# Patient Record
Sex: Female | Born: 1935 | Race: White | Hispanic: No | Marital: Married | Smoking: Never smoker
Health system: Southern US, Community
[De-identification: ages and names within clinical notes are randomized; demographics above are authoritative.]

## PROBLEM LIST (undated history)

## (undated) DIAGNOSIS — R251 Tremor, unspecified: Secondary | ICD-10-CM

## (undated) DIAGNOSIS — E7800 Pure hypercholesterolemia, unspecified: Secondary | ICD-10-CM

## (undated) DIAGNOSIS — I10 Essential (primary) hypertension: Secondary | ICD-10-CM

## (undated) DIAGNOSIS — I2510 Atherosclerotic heart disease of native coronary artery without angina pectoris: Secondary | ICD-10-CM

## (undated) DIAGNOSIS — M1990 Unspecified osteoarthritis, unspecified site: Secondary | ICD-10-CM

## (undated) DIAGNOSIS — M109 Gout, unspecified: Secondary | ICD-10-CM

## (undated) HISTORY — PX: HAND SURGERY: SHX662

## (undated) HISTORY — PX: ABDOMINAL HYSTERECTOMY: SHX81

---

## 1997-09-17 HISTORY — PX: CARDIAC CATHETERIZATION: SHX172

## 1998-01-10 ENCOUNTER — Other Ambulatory Visit: Admission: RE | Admit: 1998-01-10 | Discharge: 1998-01-10 | Payer: Self-pay | Admitting: Rheumatology

## 1998-01-20 ENCOUNTER — Other Ambulatory Visit: Admission: RE | Admit: 1998-01-20 | Discharge: 1998-01-20 | Payer: Self-pay | Admitting: Rheumatology

## 1998-02-10 ENCOUNTER — Other Ambulatory Visit: Admission: RE | Admit: 1998-02-10 | Discharge: 1998-02-10 | Payer: Self-pay | Admitting: Rheumatology

## 1998-02-17 ENCOUNTER — Ambulatory Visit: Admission: RE | Admit: 1998-02-17 | Discharge: 1998-02-17 | Payer: Self-pay | Admitting: Rheumatology

## 1999-02-27 ENCOUNTER — Ambulatory Visit (HOSPITAL_COMMUNITY): Admission: RE | Admit: 1999-02-27 | Discharge: 1999-02-27 | Payer: Self-pay | Admitting: Rheumatology

## 2000-03-08 ENCOUNTER — Encounter: Payer: Self-pay | Admitting: Rheumatology

## 2000-03-08 ENCOUNTER — Ambulatory Visit (HOSPITAL_COMMUNITY): Admission: RE | Admit: 2000-03-08 | Discharge: 2000-03-08 | Payer: Self-pay | Admitting: Rheumatology

## 2001-01-13 ENCOUNTER — Other Ambulatory Visit: Admission: RE | Admit: 2001-01-13 | Discharge: 2001-01-13 | Payer: Self-pay | Admitting: Rheumatology

## 2001-01-16 ENCOUNTER — Encounter: Payer: Self-pay | Admitting: Rheumatology

## 2001-01-16 ENCOUNTER — Encounter: Admission: RE | Admit: 2001-01-16 | Discharge: 2001-01-16 | Payer: Self-pay | Admitting: Rheumatology

## 2001-03-10 ENCOUNTER — Ambulatory Visit (HOSPITAL_COMMUNITY): Admission: RE | Admit: 2001-03-10 | Discharge: 2001-03-10 | Payer: Self-pay | Admitting: Rheumatology

## 2001-03-10 ENCOUNTER — Encounter: Payer: Self-pay | Admitting: Rheumatology

## 2001-06-09 ENCOUNTER — Encounter (INDEPENDENT_AMBULATORY_CARE_PROVIDER_SITE_OTHER): Payer: Self-pay

## 2001-06-09 ENCOUNTER — Ambulatory Visit (HOSPITAL_COMMUNITY): Admission: RE | Admit: 2001-06-09 | Discharge: 2001-06-09 | Payer: Self-pay | Admitting: Gastroenterology

## 2002-03-11 ENCOUNTER — Encounter: Payer: Self-pay | Admitting: Rheumatology

## 2002-03-11 ENCOUNTER — Ambulatory Visit (HOSPITAL_COMMUNITY): Admission: RE | Admit: 2002-03-11 | Discharge: 2002-03-11 | Payer: Self-pay | Admitting: Rheumatology

## 2002-04-24 ENCOUNTER — Encounter: Admission: RE | Admit: 2002-04-24 | Discharge: 2002-07-23 | Payer: Self-pay | Admitting: Rheumatology

## 2002-11-21 ENCOUNTER — Encounter: Payer: Self-pay | Admitting: Emergency Medicine

## 2002-11-21 ENCOUNTER — Emergency Department (HOSPITAL_COMMUNITY): Admission: EM | Admit: 2002-11-21 | Discharge: 2002-11-21 | Payer: Self-pay | Admitting: Emergency Medicine

## 2002-12-15 ENCOUNTER — Encounter: Admission: RE | Admit: 2002-12-15 | Discharge: 2002-12-15 | Payer: Self-pay | Admitting: *Deleted

## 2002-12-15 ENCOUNTER — Encounter: Payer: Self-pay | Admitting: *Deleted

## 2003-03-31 ENCOUNTER — Ambulatory Visit (HOSPITAL_COMMUNITY): Admission: RE | Admit: 2003-03-31 | Discharge: 2003-03-31 | Payer: Self-pay | Admitting: Rheumatology

## 2003-03-31 ENCOUNTER — Encounter: Payer: Self-pay | Admitting: Rheumatology

## 2003-11-14 ENCOUNTER — Encounter: Admission: RE | Admit: 2003-11-14 | Discharge: 2003-11-14 | Payer: Self-pay | Admitting: Rheumatology

## 2003-11-22 ENCOUNTER — Encounter: Admission: RE | Admit: 2003-11-22 | Discharge: 2003-11-22 | Payer: Self-pay | Admitting: Rheumatology

## 2004-01-28 ENCOUNTER — Ambulatory Visit (HOSPITAL_COMMUNITY): Admission: RE | Admit: 2004-01-28 | Discharge: 2004-01-28 | Payer: Self-pay | Admitting: Neurosurgery

## 2004-03-31 ENCOUNTER — Ambulatory Visit (HOSPITAL_COMMUNITY): Admission: RE | Admit: 2004-03-31 | Discharge: 2004-03-31 | Payer: Self-pay | Admitting: Rheumatology

## 2005-04-02 ENCOUNTER — Ambulatory Visit (HOSPITAL_COMMUNITY): Admission: RE | Admit: 2005-04-02 | Discharge: 2005-04-02 | Payer: Self-pay | Admitting: Rheumatology

## 2006-04-04 ENCOUNTER — Ambulatory Visit (HOSPITAL_COMMUNITY): Admission: RE | Admit: 2006-04-04 | Discharge: 2006-04-04 | Payer: Self-pay | Admitting: Rheumatology

## 2007-04-14 ENCOUNTER — Ambulatory Visit (HOSPITAL_COMMUNITY): Admission: RE | Admit: 2007-04-14 | Discharge: 2007-04-14 | Payer: Self-pay | Admitting: Rheumatology

## 2007-05-07 ENCOUNTER — Encounter: Admission: RE | Admit: 2007-05-07 | Discharge: 2007-06-05 | Payer: Self-pay | Admitting: Rheumatology

## 2007-09-24 ENCOUNTER — Encounter: Admission: RE | Admit: 2007-09-24 | Discharge: 2007-10-23 | Payer: Self-pay | Admitting: Rheumatology

## 2008-04-22 ENCOUNTER — Ambulatory Visit (HOSPITAL_COMMUNITY): Admission: RE | Admit: 2008-04-22 | Discharge: 2008-04-22 | Payer: Self-pay | Admitting: Rheumatology

## 2008-05-03 ENCOUNTER — Emergency Department (HOSPITAL_BASED_OUTPATIENT_CLINIC_OR_DEPARTMENT_OTHER): Admission: EM | Admit: 2008-05-03 | Discharge: 2008-05-03 | Payer: Self-pay | Admitting: Internal Medicine

## 2009-05-05 ENCOUNTER — Emergency Department (HOSPITAL_BASED_OUTPATIENT_CLINIC_OR_DEPARTMENT_OTHER): Admission: EM | Admit: 2009-05-05 | Discharge: 2009-05-05 | Payer: Self-pay | Admitting: Emergency Medicine

## 2009-06-01 ENCOUNTER — Encounter: Admission: RE | Admit: 2009-06-01 | Discharge: 2009-08-30 | Payer: Self-pay | Admitting: Rheumatology

## 2011-02-02 NOTE — Procedures (Signed)
. [facility]  Patient:    Gloria Mcintosh, SEIVERT Visit Number: [id number] MRN: [id number]          Service Type: Attending:  Verlin Grills, M.D. Dictated by:   Verlin Grills, M.D. Proc. Date: 06/09/01                             Procedure Report  PROCEDURE:  Colonoscopy.  PROCEDURE INDICATION:  [name] (date of birth: 04/10/1925) is a 77 year old female with a history of colon polyps removed in 1997.  ENDOSCOPIST:  Verlin Grills, M.D.  PREMEDICATION:  Demerol 30 mg and Versed 5 mg.  ENDOSCOPE:  Olympus pediatric colonoscope.  PROCEDURE:  After obtaining informed consent, [name] was placed in the left lateral decubitus position and administered intravenous Demerol and intravenous Versed to achieve conscious sedation for the procedure.  The patients blood pressure, oxygen saturation, and cardiac rhythm were monitored throughout the procedure and documented in the medical record.  Anal inspection was normal.  Digital rectal exam normal.  The Olympus pediatric video colonoscope was introduced into the rectum and advanced to the cecum.  Colonic preparation for the exam today was satisfactory.  The rectum was normal.  The sigmoid colon and descending colon were normal.  Splenic flexure was normal.  Transverse colon:  Normal.  Hepatic flexure:  From the hepatic flexure, two 0.5 mm sessile polyps were removed with the cold biopsy forceps.  Ascending colon:  Normal.  Cecum and ileocecal valve: From the proximal cecum, a 0.5 mm sessile polyp was removed with the cold biopsy forceps.  Polyp pathology:  Submitted colonic polyps returned tubular adenomatous polyps.  ASSESSMENT: Two 0.5 mm tubular adenomatous polyps were removed from the hepatic flexure; one 0.5 mm tubular adenomatous polyp was removed from the cecum.  RECOMMENDATIONS:  Repeat colonoscopy in five years. Dictated by:    Verlin Grills, M.D. Attending:  Verlin Grills, M.D. DD:  07/18/01 TD:  07/21/01 Job: [id number]

## 2011-10-02 ENCOUNTER — Encounter (HOSPITAL_BASED_OUTPATIENT_CLINIC_OR_DEPARTMENT_OTHER): Payer: Self-pay | Admitting: Emergency Medicine

## 2011-10-02 ENCOUNTER — Emergency Department (HOSPITAL_BASED_OUTPATIENT_CLINIC_OR_DEPARTMENT_OTHER)
Admission: EM | Admit: 2011-10-02 | Discharge: 2011-10-02 | Disposition: A | Discharge status: Home / Self Care | Payer: Medicare Other | Attending: Emergency Medicine | Admitting: Emergency Medicine

## 2011-10-02 ENCOUNTER — Emergency Department (INDEPENDENT_AMBULATORY_CARE_PROVIDER_SITE_OTHER): Payer: Medicare Other

## 2011-10-02 DIAGNOSIS — E119 Type 2 diabetes mellitus without complications: Secondary | ICD-10-CM | POA: Insufficient documentation

## 2011-10-02 DIAGNOSIS — S32509A Unspecified fracture of unspecified pubis, initial encounter for closed fracture: Secondary | ICD-10-CM | POA: Insufficient documentation

## 2011-10-02 DIAGNOSIS — M25559 Pain in unspecified hip: Secondary | ICD-10-CM

## 2011-10-02 DIAGNOSIS — S32599A Other specified fracture of unspecified pubis, initial encounter for closed fracture: Secondary | ICD-10-CM

## 2011-10-02 DIAGNOSIS — M79609 Pain in unspecified limb: Secondary | ICD-10-CM | POA: Insufficient documentation

## 2011-10-02 DIAGNOSIS — I2510 Atherosclerotic heart disease of native coronary artery without angina pectoris: Secondary | ICD-10-CM | POA: Insufficient documentation

## 2011-10-02 DIAGNOSIS — M549 Dorsalgia, unspecified: Secondary | ICD-10-CM

## 2011-10-02 DIAGNOSIS — X58XXXA Exposure to other specified factors, initial encounter: Secondary | ICD-10-CM | POA: Insufficient documentation

## 2011-10-02 DIAGNOSIS — E7800 Pure hypercholesterolemia, unspecified: Secondary | ICD-10-CM | POA: Insufficient documentation

## 2011-10-02 DIAGNOSIS — I10 Essential (primary) hypertension: Secondary | ICD-10-CM | POA: Insufficient documentation

## 2011-10-02 DIAGNOSIS — M439 Deforming dorsopathy, unspecified: Secondary | ICD-10-CM

## 2011-10-02 HISTORY — DX: Tremor, unspecified: R25.1

## 2011-10-02 HISTORY — DX: Essential (primary) hypertension: I10

## 2011-10-02 HISTORY — DX: Atherosclerotic heart disease of native coronary artery without angina pectoris: I25.10

## 2011-10-02 HISTORY — DX: Pure hypercholesterolemia, unspecified: E78.00

## 2011-10-02 MED ORDER — MORPHINE SULFATE 4 MG/ML IJ SOLN
4.0000 mg | Freq: Once | INTRAMUSCULAR | Status: AC
  Administered 2011-10-02: 4 mg via INTRAMUSCULAR
  Filled 2011-10-02: qty 1

## 2011-10-02 MED ORDER — HYDROCODONE-ACETAMINOPHEN 5-500 MG PO TABS: 1.0000 | ORAL_TABLET | Freq: Four times a day (QID) | ORAL | Status: AC | PRN

## 2011-10-02 NOTE — ED Provider Notes (Signed)
History     CSN: [id number]  Arrival date & time 10/02/11  1453   First MD Initiated Contact with Patient 10/02/11 1522      Chief Complaint  Patient presents with  . Back Pain  . Leg Pain    (Consider location/radiation/quality/duration/timing/severity/associated sxs/prior treatment) HPI Comments: Pt with pain to right lower back and buttocks radiating into right hip and upper thigh.  No radiation down legs.  No abd pain.  No neuro deficits.  Has hx of similar pain due to arthritis on left side.  No recent injury.  Saw PMD yesterday, got shot of steroid/pain med.  No better today.  Patient is a 78 y.o. female presenting with back pain. The history is provided by the patient.  Back Pain  This is a new problem. The current episode started 2 days ago. The problem occurs constantly. The problem has not changed since onset.The pain is associated with no known injury. The pain is present in the sacro-iliac joint and gluteal region. The quality of the pain is described as aching. The pain radiates to the right thigh. The pain is moderate. Exacerbated by: ambulation. The pain is the same all the time. Pertinent negatives include no chest pain, no fever, no numbness, no headaches, no abdominal pain, no bowel incontinence, no perianal numbness, no bladder incontinence, no dysuria, no pelvic pain, no paresthesias and no weakness.    Past Medical History  Diagnosis Date  . Tremors of nervous system   . Hypertension   . Diabetes mellitus   . High cholesterol   . Coronary artery disease     Past Surgical History  Procedure Date  . Abdominal hysterectomy   . Hand surgery     trigger finger and carpal tunnel    No family history on file.  History  Substance Use Topics  . Smoking status: Never Smoker   . Smokeless tobacco: Not on file  . Alcohol Use: No    OB History    Grav Para Term Preterm Abortions TAB SAB Ect Mult Living                  Review of Systems  Constitutional:  Negative for fever, chills, diaphoresis and fatigue.  HENT: Negative for congestion, rhinorrhea and sneezing.   Eyes: Negative.   Respiratory: Negative for cough, chest tightness and shortness of breath.   Cardiovascular: Negative for chest pain and leg swelling.  Gastrointestinal: Negative for nausea, vomiting, abdominal pain, diarrhea, blood in stool and bowel incontinence.  Genitourinary: Negative for bladder incontinence, dysuria, frequency, hematuria, flank pain, difficulty urinating and pelvic pain.  Musculoskeletal: Positive for back pain. Negative for arthralgias.  Skin: Negative for rash.  Neurological: Negative for dizziness, speech difficulty, weakness, numbness, headaches and paresthesias.    Allergies  Inderal  Home Medications   Current Outpatient Rx  Name Route Sig Dispense Refill  . HYDROCODONE-ACETAMINOPHEN 5-500 MG PO TABS Oral Take 1-2 tablets by mouth every 6 (six) hours as needed for pain. 15 tablet 0    BP 167/74  Pulse 74  Temp(Src) 97.9 F (36.6 C) (Oral)  Resp 16  Wt 144 lb (65.318 kg)  SpO2 96%  Physical Exam  Constitutional: She is oriented to person, place, and time. She appears well-developed and well-nourished.  HENT:  Head: Normocephalic and atraumatic.  Eyes: Pupils are equal, round, and reactive to light.  Neck: Normal range of motion. Neck supple.  Cardiovascular: Normal rate and regular rhythm.   Murmur heard. Pulmonary/Chest:  Effort normal and breath sounds normal. No respiratory distress. She has no wheezes. She has no rales. She exhibits no tenderness.  Abdominal: Soft. Bowel sounds are normal. There is no tenderness. There is no rebound and no guarding.  Musculoskeletal: Normal range of motion. She exhibits no edema.       Mild pain on ROM right hip.  Some pain on palpation to right SI joint/sciatic area.  No pain along spine, but mild pain to right lower lumbar paraspinal area.  Neg SLR.  Normal symmetric pulses in feet.  Normal  sensation/motor function.  Normal ambulation, although hurts worse in groin/upper right thigh when she ambulates  Lymphadenopathy:    She has no cervical adenopathy.  Neurological: She is alert and oriented to person, place, and time.  Skin: Skin is warm and dry. No rash noted.  Psychiatric: She has a normal mood and affect.    ED Course  Procedures (including critical care time) No results found for this or any previous visit. Dg Lumbar Spine Complete  10/02/2011  *RADIOLOGY REPORT*  Clinical Data: Back pain  LUMBAR SPINE - COMPLETE 4+ VIEW  Comparison: 11/02/2003, lumbar spine MRI 11/14/2003  Findings: Again noted are endplate compression deformities at T10- T12.  Mild rightward curvature centered at L1 noted.  Increase in mild superior endplate compression deformity is noted at to L1.  8 mm anterolisthesis of L4 on L5 noted.  Atheromatous calcification of the normal caliber aorta noted.  Non calcified aneurysm could be radiographically occult.  IMPRESSION: Multiple lumbar spine compression deformities are again noted. There has been minimal increase in superior endplate compression deformity at L1 but no bony retropulsion or other acute abnormality.  Allowing for differences in technique, stable anterolisthesis of L4 on L5.  Original Report Authenticated By: Harrel Lemon, M.D.   Dg Hip Complete Right  10/02/2011  *RADIOLOGY REPORT*  Clinical Data: Right hip pain  RIGHT HIP - COMPLETE 2+ VIEW  Comparison: None.  Findings: Mild bilateral hip degenerative change is noted. Sacroiliac joints are normal. There is minimal cortical irregularity involving the right inferior pubic ramus.  No right hip fracture or dislocation.  IMPRESSION: Minimal cortical irregularity involving the right inferior pubic ramus.  This could represent an insufficiency fracture.  Consider MRI for further evaluation or bone scan if MRI is contraindicated.  Original Report Authenticated By: Harrel Lemon, M.D.     1.  Fracture of pubic ramus       MDM  Most consistent with her past arthritis pain.  Will check x-rays to r/o fracture.  Does not sound suggestive of aortic aneurysym/disscetion (is reproducible with palpation and seems lower in joint area), no swelling/pain to suggest DVT  Exam is consistent with radiographic evidence of possible inferior pubic rami fx.  Feel this is likely etiology for pt's pain.  Do not feel that MRI is needed urgently.  Advised f/u with PMD.  Can refer to ortho as needed      Rolan Bucco, MD 10/02/11 1651

## 2011-10-02 NOTE — ED Notes (Signed)
Pt c/o back & RLE pain since Sun; no injury; saw PMD yest for same; no relief after "cortisone" shot

## 2011-10-02 NOTE — ED Notes (Signed)
Pt's daughter states PCP increased BP med-pt states she had delivered today but has not started yet

## 2012-07-25 ENCOUNTER — Emergency Department (HOSPITAL_BASED_OUTPATIENT_CLINIC_OR_DEPARTMENT_OTHER): Payer: Medicare Other

## 2012-07-25 ENCOUNTER — Other Ambulatory Visit: Payer: Self-pay

## 2012-07-25 ENCOUNTER — Emergency Department (HOSPITAL_BASED_OUTPATIENT_CLINIC_OR_DEPARTMENT_OTHER)
Admission: EM | Admit: 2012-07-25 | Discharge: 2012-07-25 | Disposition: A | Discharge status: Home / Self Care | Payer: Medicare Other | Attending: Emergency Medicine | Admitting: Emergency Medicine

## 2012-07-25 ENCOUNTER — Encounter (HOSPITAL_BASED_OUTPATIENT_CLINIC_OR_DEPARTMENT_OTHER): Payer: Self-pay | Admitting: *Deleted

## 2012-07-25 DIAGNOSIS — I69998 Other sequelae following unspecified cerebrovascular disease: Secondary | ICD-10-CM | POA: Insufficient documentation

## 2012-07-25 DIAGNOSIS — R110 Nausea: Secondary | ICD-10-CM | POA: Insufficient documentation

## 2012-07-25 DIAGNOSIS — M6281 Muscle weakness (generalized): Secondary | ICD-10-CM | POA: Insufficient documentation

## 2012-07-25 DIAGNOSIS — G252 Other specified forms of tremor: Secondary | ICD-10-CM | POA: Insufficient documentation

## 2012-07-25 DIAGNOSIS — R42 Dizziness and giddiness: Secondary | ICD-10-CM

## 2012-07-25 DIAGNOSIS — I10 Essential (primary) hypertension: Secondary | ICD-10-CM | POA: Insufficient documentation

## 2012-07-25 DIAGNOSIS — I2510 Atherosclerotic heart disease of native coronary artery without angina pectoris: Secondary | ICD-10-CM | POA: Insufficient documentation

## 2012-07-25 DIAGNOSIS — G250 Essential tremor: Secondary | ICD-10-CM | POA: Insufficient documentation

## 2012-07-25 DIAGNOSIS — E119 Type 2 diabetes mellitus without complications: Secondary | ICD-10-CM | POA: Insufficient documentation

## 2012-07-25 DIAGNOSIS — Z79899 Other long term (current) drug therapy: Secondary | ICD-10-CM | POA: Insufficient documentation

## 2012-07-25 DIAGNOSIS — E7800 Pure hypercholesterolemia, unspecified: Secondary | ICD-10-CM | POA: Insufficient documentation

## 2012-07-25 LAB — URINALYSIS, ROUTINE W REFLEX MICROSCOPIC
Bilirubin Urine: NEGATIVE
Glucose, UA: NEGATIVE mg/dL
Ketones, ur: NEGATIVE mg/dL
Protein, ur: 100 mg/dL — AB
Urobilinogen, UA: 0.2 mg/dL (ref 0.0–1.0)

## 2012-07-25 LAB — URINE MICROSCOPIC-ADD ON

## 2012-07-25 LAB — COMPREHENSIVE METABOLIC PANEL
ALT: 24 U/L (ref 0–35)
AST: 38 U/L — ABNORMAL HIGH (ref 0–37)
Albumin: 3.7 g/dL (ref 3.5–5.2)
Alkaline Phosphatase: 72 U/L (ref 39–117)
BUN: 33 mg/dL — ABNORMAL HIGH (ref 6–23)
CO2: 21 mEq/L (ref 19–32)
Calcium: 10 mg/dL (ref 8.4–10.5)
Chloride: 99 mEq/L (ref 96–112)
Creatinine, Ser: 1.2 mg/dL — ABNORMAL HIGH (ref 0.50–1.10)
GFR calc Af Amer: 47 mL/min — ABNORMAL LOW (ref >90)
GFR calc non Af Amer: 40 mL/min — ABNORMAL LOW (ref >90)
Glucose, Bld: 116 mg/dL — ABNORMAL HIGH (ref 70–99)
Potassium: 4.2 mEq/L (ref 3.5–5.1)
Sodium: 138 mEq/L (ref 135–145)
Total Bilirubin: 0.2 mg/dL — ABNORMAL LOW (ref 0.3–1.2)
Total Protein: 7.4 g/dL (ref 6.0–8.3)

## 2012-07-25 LAB — CBC WITH DIFFERENTIAL/PLATELET
Eosinophils Absolute: 0.4 10*3/uL (ref 0.0–0.7)
HCT: 33.6 % — ABNORMAL LOW (ref 36.0–46.0)
Hemoglobin: 11.2 g/dL — ABNORMAL LOW (ref 12.0–15.0)
Lymphocytes Relative: 25 % (ref 12–46)
Lymphs Abs: 2 10*3/uL (ref 0.7–4.0)
MCH: 32.1 pg (ref 26.0–34.0)
MCV: 96.3 fL (ref 78.0–100.0)
Monocytes Absolute: 0.8 10*3/uL (ref 0.1–1.0)
Monocytes Relative: 10 % (ref 3–12)
Neutrophils Relative %: 60 % (ref 43–77)
Platelets: 143 10*3/uL — ABNORMAL LOW (ref 150–400)

## 2012-07-25 MED ORDER — METOPROLOL TARTRATE 50 MG PO TABS
50.0000 mg | ORAL_TABLET | Freq: Once | ORAL | Status: AC
  Administered 2012-07-25: 50 mg via ORAL
  Filled 2012-07-25: qty 1

## 2012-07-25 MED ORDER — MECLIZINE HCL 25 MG PO TABS: 25.0000 mg | ORAL_TABLET | Freq: Three times a day (TID) | ORAL | Status: DC | PRN

## 2012-07-25 MED ORDER — MECLIZINE HCL 25 MG PO TABS
25.0000 mg | ORAL_TABLET | Freq: Once | ORAL | Status: AC
  Administered 2012-07-25: 25 mg via ORAL
  Filled 2012-07-25: qty 1

## 2012-07-25 NOTE — ED Notes (Signed)
Complaint of dizzy episode this am that resolved without intervention. Dizziness returned this afternoon.. She lives at [facility] facility and staff called her daughter and notified her of pts complaint. She is alert oriented. Denies pain.

## 2012-07-25 NOTE — ED Provider Notes (Signed)
History     CSN: [id number]  Arrival date & time 07/25/12  5621   First MD Initiated Contact with Patient 07/25/12 1911      Chief Complaint  Patient presents with  . Dizziness    (Consider location/radiation/quality/duration/timing/severity/associated sxs/prior treatment) HPI Comments: Patient with several episodes of dizziness that started this morning.  She reports feeling like she is spinning when she stands and moves.  She feels nauseated and is improved with rest.  No headache, injury or trauma.  No fevers or chills.    Patient is a 78 y.o. female presenting with weakness. The history is provided by the patient.  Weakness The primary symptoms include dizziness. Episode onset: this morning. The neurological symptoms are diffuse. Context: none.  Dizziness also occurs with weakness.  Additional symptoms include weakness and vertigo.    Past Medical History  Diagnosis Date  . Tremors of nervous system   . Hypertension   . Diabetes mellitus   . High cholesterol   . Coronary artery disease     Past Surgical History  Procedure Date  . Abdominal hysterectomy   . Hand surgery     trigger finger and carpal tunnel    No family history on file.  History  Substance Use Topics  . Smoking status: Never Smoker   . Smokeless tobacco: Not on file  . Alcohol Use: No    OB History    Grav Para Term Preterm Abortions TAB SAB Ect Mult Living                  Review of Systems  Neurological: Positive for dizziness, vertigo and weakness.  All other systems reviewed and are negative.    Allergies  Inderal  Home Medications   Current Outpatient Rx  Name  Route  Sig  Dispense  Refill  . COLESEVELAM HCL 625 MG PO TABS   Oral   Take 1,875 mg by mouth 2 (two) times daily with a meal.         . DICLOFENAC SODIUM PO   Oral   Take by mouth.         Marland Kitchen GABAPENTIN PO   Oral   Take by mouth.         Marland Kitchen MICROZIDE PO   Oral   Take by mouth.         .  SYNTHROID PO   Oral   Take by mouth.         Marland Kitchen ZESTRIL PO   Oral   Take by mouth.         . METFORMIN HCL PO   Oral   Take by mouth.         . METOPROLOL TARTRATE PO   Oral   Take by mouth.         . TRAMADOL HCL PO   Oral   Take by mouth.           BP 206/74  Pulse 76  Temp 98.4 F (36.9 C) (Oral)  Resp 20  SpO2 96%  Physical Exam  Nursing note and vitals reviewed. Constitutional: She is oriented to person, place, and time. She appears well-developed and well-nourished. No distress.  HENT:  Head: Normocephalic and atraumatic.  Neck: Normal range of motion. Neck supple.  Cardiovascular: Normal rate and regular rhythm.  Exam reveals no gallop and no friction rub.   No murmur heard. Pulmonary/Chest: Effort normal and breath sounds normal. No respiratory distress. She has no wheezes.  Abdominal: Soft. Bowel sounds are normal. She exhibits no distension. There is no tenderness.  Musculoskeletal: Normal range of motion.  Neurological: She is alert and oriented to person, place, and time. No cranial nerve deficit. She exhibits normal muscle tone. Coordination normal.  Skin: Skin is warm and dry. She is not diaphoretic.    ED Course  Procedures (including critical care time)  Labs Reviewed  GLUCOSE, CAPILLARY - Abnormal; Notable for the following:    Glucose-Capillary 120 (*)     All other components within normal limits  URINALYSIS, ROUTINE W REFLEX MICROSCOPIC  CBC WITH DIFFERENTIAL  COMPREHENSIVE METABOLIC PANEL   No results found.   No diagnosis found.   Date: 07/25/2012  Rate: 75  Rhythm: normal sinus rhythm  QRS Axis: normal  Intervals: normal  ST/T Wave abnormalities: nonspecific T wave changes  Conduction Disutrbances:right bundle branch block  Narrative Interpretation:   Old EKG Reviewed: unchanged    MDM  The patient presents with symptoms and exam consistent with peripheral vertigo.  She was given meclizine and underwent a ct of  the head, ekg, and labs.  The all were essentially unremarkable.  She was hypertensive initially while in the ED but she had not taken her BP meds.  She was given a dose of metoprolol which did return her BP to the acceptable range.  She will be discharged with a prescription for meclizine, to return or follow up for any problems.        Geoffery Lyons, MD 07/26/12 [phone]

## 2012-07-25 NOTE — ED Notes (Signed)
Found patients family member to be pushing patients stretcher to restroom. On arrival to restroom, patients family member stated the patient had to use the restroom. Patients family member encouraged to utilize call bell in the future as pushing the patients stretcher can be harmful to both patient and family member. Patient assisted to restroom and pushed back to room via stretcher by this EMT.

## 2012-07-28 LAB — URINE CULTURE

## 2012-07-29 NOTE — ED Notes (Signed)
+   Urine Chart sent to EDP office for review. 

## 2012-07-30 NOTE — ED Notes (Signed)
Chart returned from EDP office  with rx written by Felicie Morn for doxycycline 100 mg BID x 10 days need to be called to pharmacy.

## 2012-08-02 ENCOUNTER — Telehealth (HOSPITAL_COMMUNITY): Payer: Self-pay | Admitting: Emergency Medicine

## 2012-09-17 DIAGNOSIS — M109 Gout, unspecified: Secondary | ICD-10-CM

## 2012-09-17 HISTORY — DX: Gout, unspecified: M10.9

## 2012-12-21 ENCOUNTER — Emergency Department (HOSPITAL_BASED_OUTPATIENT_CLINIC_OR_DEPARTMENT_OTHER)
Admission: EM | Admit: 2012-12-21 | Discharge: 2012-12-21 | Disposition: A | Discharge status: Home / Self Care | Payer: Medicare Other | Attending: Emergency Medicine | Admitting: Emergency Medicine

## 2012-12-21 ENCOUNTER — Encounter (HOSPITAL_BASED_OUTPATIENT_CLINIC_OR_DEPARTMENT_OTHER): Payer: Self-pay | Admitting: *Deleted

## 2012-12-21 ENCOUNTER — Emergency Department (HOSPITAL_BASED_OUTPATIENT_CLINIC_OR_DEPARTMENT_OTHER): Payer: Medicare Other

## 2012-12-21 DIAGNOSIS — R110 Nausea: Secondary | ICD-10-CM | POA: Insufficient documentation

## 2012-12-21 DIAGNOSIS — E7800 Pure hypercholesterolemia, unspecified: Secondary | ICD-10-CM | POA: Insufficient documentation

## 2012-12-21 DIAGNOSIS — Z8669 Personal history of other diseases of the nervous system and sense organs: Secondary | ICD-10-CM | POA: Insufficient documentation

## 2012-12-21 DIAGNOSIS — Z79899 Other long term (current) drug therapy: Secondary | ICD-10-CM | POA: Insufficient documentation

## 2012-12-21 DIAGNOSIS — R142 Eructation: Secondary | ICD-10-CM | POA: Insufficient documentation

## 2012-12-21 DIAGNOSIS — I2510 Atherosclerotic heart disease of native coronary artery without angina pectoris: Secondary | ICD-10-CM | POA: Insufficient documentation

## 2012-12-21 DIAGNOSIS — E119 Type 2 diabetes mellitus without complications: Secondary | ICD-10-CM | POA: Insufficient documentation

## 2012-12-21 DIAGNOSIS — R0602 Shortness of breath: Secondary | ICD-10-CM | POA: Insufficient documentation

## 2012-12-21 DIAGNOSIS — Z90710 Acquired absence of both cervix and uterus: Secondary | ICD-10-CM | POA: Insufficient documentation

## 2012-12-21 DIAGNOSIS — R141 Gas pain: Secondary | ICD-10-CM | POA: Insufficient documentation

## 2012-12-21 DIAGNOSIS — I10 Essential (primary) hypertension: Secondary | ICD-10-CM | POA: Insufficient documentation

## 2012-12-21 DIAGNOSIS — K5900 Constipation, unspecified: Secondary | ICD-10-CM | POA: Insufficient documentation

## 2012-12-21 DIAGNOSIS — N200 Calculus of kidney: Secondary | ICD-10-CM | POA: Insufficient documentation

## 2012-12-21 LAB — CBC WITH DIFFERENTIAL/PLATELET
Basophils Absolute: 0.1 10*3/uL (ref 0.0–0.1)
Eosinophils Relative: 2 % (ref 0–5)
HCT: 37 % (ref 36.0–46.0)
Lymphocytes Relative: 13 % (ref 12–46)
Lymphs Abs: 1.7 10*3/uL (ref 0.7–4.0)
MCV: 97.1 fL (ref 78.0–100.0)
Monocytes Absolute: 1 10*3/uL (ref 0.1–1.0)
RDW: 13.4 % (ref 11.5–15.5)
WBC: 13 10*3/uL — ABNORMAL HIGH (ref 4.0–10.5)

## 2012-12-21 LAB — COMPREHENSIVE METABOLIC PANEL
CO2: 20 mEq/L (ref 19–32)
Calcium: 10 mg/dL (ref 8.4–10.5)
Creatinine, Ser: 1.3 mg/dL — ABNORMAL HIGH (ref 0.50–1.10)
GFR calc Af Amer: 42 mL/min — ABNORMAL LOW (ref >90)
GFR calc non Af Amer: 36 mL/min — ABNORMAL LOW (ref >90)
Glucose, Bld: 201 mg/dL — ABNORMAL HIGH (ref 70–99)

## 2012-12-21 LAB — URINALYSIS, ROUTINE W REFLEX MICROSCOPIC
Leukocytes, UA: NEGATIVE
Protein, ur: 100 mg/dL — AB
Urobilinogen, UA: 0.2 mg/dL (ref 0.0–1.0)

## 2012-12-21 LAB — LIPASE, BLOOD: Lipase: 98 U/L — ABNORMAL HIGH (ref 11–59)

## 2012-12-21 LAB — LACTIC ACID, PLASMA: Lactic Acid, Venous: 3.1 mmol/L — ABNORMAL HIGH (ref 0.5–2.2)

## 2012-12-21 MED ORDER — OXYCODONE-ACETAMINOPHEN 5-325 MG PO TABS: 1.0000 | ORAL_TABLET | ORAL | Status: DC | PRN

## 2012-12-21 MED ORDER — MORPHINE SULFATE 4 MG/ML IJ SOLN
4.0000 mg | Freq: Once | INTRAMUSCULAR | Status: AC
  Administered 2012-12-21: 4 mg via INTRAVENOUS
  Filled 2012-12-21: qty 1

## 2012-12-21 MED ORDER — OXYCODONE-ACETAMINOPHEN 5-325 MG PO TABS
1.0000 | ORAL_TABLET | Freq: Once | ORAL | Status: AC
  Administered 2012-12-21: 1 via ORAL
  Filled 2012-12-21 (×2): qty 1

## 2012-12-21 MED ORDER — IOHEXOL 300 MG/ML  SOLN
50.0000 mL | Freq: Once | INTRAMUSCULAR | Status: AC | PRN
  Administered 2012-12-21: 50 mL via ORAL

## 2012-12-21 MED ORDER — IOHEXOL 300 MG/ML  SOLN
100.0000 mL | Freq: Once | INTRAMUSCULAR | Status: AC | PRN
  Administered 2012-12-21: 100 mL via INTRAVENOUS

## 2012-12-21 MED ORDER — TAMSULOSIN HCL 0.4 MG PO CAPS: 0.4000 mg | ORAL_CAPSULE | Freq: Every day | ORAL | Status: AC

## 2012-12-21 MED ORDER — GLIPIZIDE 10 MG PO TABS: 5.0000 mg | ORAL_TABLET | Freq: Two times a day (BID) | ORAL | Status: DC

## 2012-12-21 MED ORDER — ONDANSETRON HCL 4 MG/2ML IJ SOLN
4.0000 mg | Freq: Once | INTRAMUSCULAR | Status: AC
  Administered 2012-12-21: 4 mg via INTRAVENOUS
  Filled 2012-12-21: qty 2

## 2012-12-21 NOTE — ED Notes (Signed)
MD with pt  

## 2012-12-21 NOTE — ED Notes (Signed)
Pt describes LLQ pain and high bp onset 1 hr ago. Abd distended.

## 2012-12-21 NOTE — ED Provider Notes (Addendum)
History    This chart was scribed for Gloria Sprout, MD scribed by Magnus Sinning. The patient was seen in room MH02/MH02 at 00:55.   CSN: [id number]  Arrival date & time 12/21/12  0011     Chief Complaint  Patient presents with  . Abdominal Pain    (Consider location/radiation/quality/duration/timing/severity/associated sxs/prior treatment) Patient is a 79 y.o. female presenting with abdominal pain. The history is provided by the patient. No language interpreter was used.  Abdominal Pain Associated symptoms: constipation and nausea   Associated symptoms: no diarrhea and no vomiting    Gloria Mcintosh is a 79 y.o. female who presents to the Emergency Department complaining of sudden onset constant severe abd pain, onset one hour ago with associated abnormal bloating, SOB, and nausea. The abd pain reportedly woke the patient from her sleep and she states she has had two BMs today and she notes mild constipation. Pain is 9/10 without radiation.  She initially denies any abd surgeries, but later notes that she did have caesarean section many years ago. She denies any recent medication changes or recent stopping in medications.   Past Medical History  Diagnosis Date  . Tremors of nervous system   . Hypertension   . Diabetes mellitus   . High cholesterol   . Coronary artery disease     Past Surgical History  Procedure Laterality Date  . Abdominal hysterectomy    . Hand surgery      trigger finger and carpal tunnel    History reviewed. No pertinent family history.  History  Substance Use Topics  . Smoking status: Never Smoker   . Smokeless tobacco: Not on file  . Alcohol Use: No    Review of Systems  Gastrointestinal: Positive for nausea, abdominal pain, constipation and abdominal distention. Negative for vomiting and diarrhea.  All other systems reviewed and are negative.    Allergies  Inderal  Home Medications   Current Outpatient Rx  Name  Route  Sig   Dispense  Refill  . colesevelam (WELCHOL) 625 MG tablet   Oral   Take 1,875 mg by mouth 2 (two) times daily with a meal.         . DICLOFENAC SODIUM PO   Oral   Take by mouth.         Marland Kitchen GABAPENTIN PO   Oral   Take by mouth.         . Hydrochlorothiazide (MICROZIDE PO)   Oral   Take by mouth.         . Levothyroxine Sodium (SYNTHROID PO)   Oral   Take by mouth.         . Lisinopril (ZESTRIL PO)   Oral   Take by mouth.         . meclizine (ANTIVERT) 25 MG tablet   Oral   Take 1 tablet (25 mg total) by mouth 3 (three) times daily as needed.   15 tablet   1   . METFORMIN HCL PO   Oral   Take by mouth.         . METOPROLOL TARTRATE PO   Oral   Take by mouth.         . TRAMADOL HCL PO   Oral   Take by mouth.           BP 205/130  Pulse 72  Temp(Src) 97.7 F (36.5 C) (Oral)  Resp 20  Ht 5\' 1"  (1.549 m)  Wt 141 lb (63.957  kg)  BMI 26.66 kg/m2  SpO2 94%  Physical Exam  Nursing note and vitals reviewed. Constitutional: She is oriented to person, place, and time. She appears well-developed and well-nourished. No distress.  HENT:  Head: Normocephalic and atraumatic.  Eyes: Conjunctivae and EOM are normal.  Neck: Neck supple. No tracheal deviation present.  Cardiovascular: Normal rate, regular rhythm, normal heart sounds and intact distal pulses.   Pulmonary/Chest: Effort normal and breath sounds normal. No respiratory distress. She has no wheezes. She has no rales.  Abdominal: Soft. Bowel sounds are normal. She exhibits distension. There is no rebound and no guarding.  Firm tender palpable mass in mid lateral left abd that is tender. No hernias  Musculoskeletal: Normal range of motion. She exhibits no edema and no tenderness.  Neurological: She is alert and oriented to person, place, and time. No sensory deficit.  Skin: Skin is warm and dry.  Psychiatric: She has a normal mood and affect. Her behavior is normal.    ED Course  Procedures  (including critical care time) DIAGNOSTIC STUDIES: Oxygen Saturation is 94% on room air, adequate by my interpretation.    COORDINATION OF CARE: 00:57: Physical exam performed.   Labs Reviewed  CBC WITH DIFFERENTIAL - Abnormal; Notable for the following:    WBC 13.0 (*)    RBC 3.81 (*)    Neutro Abs 10.0 (*)    All other components within normal limits  COMPREHENSIVE METABOLIC PANEL - Abnormal; Notable for the following:    Glucose, Bld 201 (*)    BUN 31 (*)    Creatinine, Ser 1.30 (*)    AST 43 (*)    Total Bilirubin 0.2 (*)    GFR calc non Af Amer 36 (*)    GFR calc Af Amer 42 (*)    All other components within normal limits  LIPASE, BLOOD - Abnormal; Notable for the following:    Lipase 98 (*)    All other components within normal limits  URINALYSIS, ROUTINE W REFLEX MICROSCOPIC - Abnormal; Notable for the following:    APPearance CLOUDY (*)    Glucose, UA 100 (*)    Hgb urine dipstick MODERATE (*)    Protein, ur 100 (*)    All other components within normal limits  LACTIC ACID, PLASMA - Abnormal; Notable for the following:    Lactic Acid, Venous 3.1 (*)    All other components within normal limits  URINE MICROSCOPIC-ADD ON - Abnormal; Notable for the following:    Bacteria, UA MANY (*)    All other components within normal limits  URINE CULTURE   Ct Abdomen Pelvis W Contrast  12/21/2012  *RADIOLOGY REPORT*  Clinical Data: Left abdominal pain with palpable mass.  Nausea and constipation.  White cell count 13.  CT ABDOMEN AND PELVIS WITH CONTRAST  Technique:  Multidetector CT imaging of the abdomen and pelvis was performed following the standard protocol during bolus administration of intravenous contrast.  Contrast: 50mL OMNIPAQUE IOHEXOL 300 MG/ML  SOLN, OMNIPAQUE IOHEXOL 300 MG/ML  SOLN  Comparison: None.  Findings: Atelectasis in the lung bases.  Calcification of the aorta and coronary arteries.  There is enlargement of the lateral segment of the left lobe of the  liver and the caudate lobe with mild nodular contour of the liver suggesting cirrhosis.  Low attenuation lesion in the anterior segment of the right lobe of the liver measuring 13 mm diameter and the second adjacent lesion measuring 3 mm diameter.  These do not  completely fill in on delayed imaging.  These may represent hemangiomas but have a somewhat atypical appearance.  Consider elective MRI for further evaluation of focal liver lesions. Calcified granuloma in the liver.  The gallbladder, spleen, pancreas, and adrenal glands are unremarkable.  There is enlargement of the left kidney with pararenal stranding.  4 mm stone in the proximal left ureter at the ureteropelvic junction with mild proximal pyelocaliectasis consistent with moderate obstruction.  There are also parapelvic cysts on the left. Bilateral renal parenchymal atrophy.  No solid lesions appreciated. Calcification of the aorta without aneurysm.  Inferior vena cava and retroperitoneal lymph nodes are unremarkable.  Stomach and small bowel are normal.  Stool filled colon without abnormal distension.  Prominent visceral adipose tissues.  No free air or free fluid in the abdomen.  Pelvis:  No free or loculated pelvic fluid collections.  Uterus appears to be surgically absent.  No abnormal adnexal masses.  No evidence of diverticulitis.  Appendix is not visualized.  No free or loculated pelvic fluid collections.  No significant pelvic lymphadenopathy.  IMPRESSION: 4 mm stone in the left ureteropelvic junction with moderate proximal obstruction.  Indeterminate lesion in the right lobe of the liver.  Consider MRI for further characterization. Configuration of the liver suggests cirrhosis.   Original Report Authenticated By: Burman Nieves, M.D.      1. Kidney stone       MDM   Patient with abrupt onset of abdominal pain tonight that woke her from sleep. She denies any urinary symptoms and states she had 2 bowel movements today with possible mild  constipation. She has had severe nausea since waking up but denies vomiting. Patient was normal when she went to bed. She does have a history of coronary artery disease, diabetes and hypertension. She is hypertensive today to 205/130. Her pulse and respiratory rate are normal her O2 sats are 94%. She does have a palpable mass in her left abdomen of uncertain etiology. She is also distended but her abdomen is soft.  Concern for kidney stone versus intestinal pathology such as obstruction or volvulus.  Lower concern for aorta pathology such as AAA rupture or dissection given the patient has normal pulses in all extremities and is hypertensive. CBC, CMP, lipase, UA, lactic acid, CT of abdomen and pelvis pending. Patient given IV pain and nausea control  3:50 AM Mild leukocytosis and UA without signficant infection but is positive for blood.  Lipase indeterminate with mild elevation.  CT with evidence of 4mm left kidney stone which would explain pt's sx.  After 4 of morphine pain down to a 7 will give second dose.  Pt is diabetic and will have her hold her metformin after CT with contrast.  She was given glipizide for the next 3 days.  4:58 AM Pain is improved and will d/c home.  I personally performed the services described in this documentation, which was scribed in my presence.  The recorded information has been reviewed and considered.         Gloria Sprout, MD 12/21/12 3086  Gloria Sprout, MD 12/21/12 5784  Gloria Sprout, MD 12/21/12 0500

## 2012-12-23 LAB — URINE CULTURE: Colony Count: >=100000

## 2012-12-24 ENCOUNTER — Telehealth (HOSPITAL_COMMUNITY): Payer: Self-pay | Admitting: Emergency Medicine

## 2012-12-24 NOTE — ED Notes (Signed)
Patient has +Urine culture. °

## 2012-12-24 NOTE — ED Notes (Signed)
+   Urine Chart sent to EDP office for review. 

## 2012-12-25 ENCOUNTER — Telehealth (HOSPITAL_COMMUNITY): Payer: Self-pay | Admitting: Emergency Medicine

## 2012-12-26 ENCOUNTER — Emergency Department (HOSPITAL_COMMUNITY): Payer: Medicare Other

## 2012-12-26 ENCOUNTER — Inpatient Hospital Stay (HOSPITAL_COMMUNITY)
Admission: EM | Admit: 2012-12-26 | Discharge: 2013-01-03 | DRG: 699 | Disposition: A | Discharge status: Skilled Nursing Facility | Payer: Medicare Other | Attending: Internal Medicine | Admitting: Internal Medicine

## 2012-12-26 ENCOUNTER — Encounter (HOSPITAL_COMMUNITY): Payer: Self-pay | Admitting: Emergency Medicine

## 2012-12-26 DIAGNOSIS — E119 Type 2 diabetes mellitus without complications: Secondary | ICD-10-CM

## 2012-12-26 DIAGNOSIS — N135 Crossing vessel and stricture of ureter without hydronephrosis: Secondary | ICD-10-CM | POA: Diagnosis present

## 2012-12-26 DIAGNOSIS — F29 Unspecified psychosis not due to a substance or known physiological condition: Secondary | ICD-10-CM | POA: Diagnosis present

## 2012-12-26 DIAGNOSIS — N19 Unspecified kidney failure: Secondary | ICD-10-CM

## 2012-12-26 DIAGNOSIS — Z66 Do not resuscitate: Secondary | ICD-10-CM | POA: Diagnosis not present

## 2012-12-26 DIAGNOSIS — I10 Essential (primary) hypertension: Secondary | ICD-10-CM | POA: Diagnosis present

## 2012-12-26 DIAGNOSIS — I2510 Atherosclerotic heart disease of native coronary artery without angina pectoris: Secondary | ICD-10-CM | POA: Diagnosis present

## 2012-12-26 DIAGNOSIS — N183 Chronic kidney disease, stage 3 unspecified: Secondary | ICD-10-CM | POA: Diagnosis present

## 2012-12-26 DIAGNOSIS — R4182 Altered mental status, unspecified: Secondary | ICD-10-CM

## 2012-12-26 DIAGNOSIS — E871 Hypo-osmolality and hyponatremia: Secondary | ICD-10-CM

## 2012-12-26 DIAGNOSIS — N390 Urinary tract infection, site not specified: Secondary | ICD-10-CM

## 2012-12-26 DIAGNOSIS — N200 Calculus of kidney: Secondary | ICD-10-CM | POA: Diagnosis present

## 2012-12-26 DIAGNOSIS — I129 Hypertensive chronic kidney disease with stage 1 through stage 4 chronic kidney disease, or unspecified chronic kidney disease: Secondary | ICD-10-CM | POA: Diagnosis present

## 2012-12-26 DIAGNOSIS — N139 Obstructive and reflux uropathy, unspecified: Principal | ICD-10-CM | POA: Diagnosis present

## 2012-12-26 DIAGNOSIS — N1330 Unspecified hydronephrosis: Secondary | ICD-10-CM | POA: Diagnosis present

## 2012-12-26 DIAGNOSIS — N179 Acute kidney failure, unspecified: Secondary | ICD-10-CM | POA: Diagnosis present

## 2012-12-26 HISTORY — DX: Gout, unspecified: M10.9

## 2012-12-26 HISTORY — DX: Unspecified osteoarthritis, unspecified site: M19.90

## 2012-12-26 LAB — URINALYSIS, ROUTINE W REFLEX MICROSCOPIC
Glucose, UA: NEGATIVE mg/dL
Nitrite: POSITIVE — AB
Protein, ur: 100 mg/dL — AB
pH: 5.5 (ref 5.0–8.0)

## 2012-12-26 LAB — COMPREHENSIVE METABOLIC PANEL
AST: 26 U/L (ref 0–37)
Albumin: 2.5 g/dL — ABNORMAL LOW (ref 3.5–5.2)
Calcium: 9 mg/dL (ref 8.4–10.5)
Chloride: 85 mEq/L — ABNORMAL LOW (ref 96–112)
Creatinine, Ser: 4.69 mg/dL — ABNORMAL HIGH (ref 0.50–1.10)
Total Bilirubin: 0.3 mg/dL (ref 0.3–1.2)
Total Protein: 7.2 g/dL (ref 6.0–8.3)

## 2012-12-26 LAB — CBC WITH DIFFERENTIAL/PLATELET
Basophils Absolute: 0 10*3/uL (ref 0.0–0.1)
Basophils Relative: 0 % (ref 0–1)
Eosinophils Absolute: 0 10*3/uL (ref 0.0–0.7)
HCT: 28.7 % — ABNORMAL LOW (ref 36.0–46.0)
MCH: 31.5 pg (ref 26.0–34.0)
MCHC: 35.9 g/dL (ref 30.0–36.0)
Monocytes Absolute: 0.5 10*3/uL (ref 0.1–1.0)
Neutro Abs: 11.8 10*3/uL — ABNORMAL HIGH (ref 1.7–7.7)
RDW: 13.5 % (ref 11.5–15.5)

## 2012-12-26 LAB — PRO B NATRIURETIC PEPTIDE: Pro B Natriuretic peptide (BNP): 2178 pg/mL — ABNORMAL HIGH (ref 0–450)

## 2012-12-26 LAB — URINE MICROSCOPIC-ADD ON

## 2012-12-26 MED ORDER — SODIUM CHLORIDE 0.9 % IV BOLUS (SEPSIS)
1000.0000 mL | Freq: Once | INTRAVENOUS | Status: AC
  Administered 2012-12-26: 1000 mL via INTRAVENOUS

## 2012-12-26 MED ORDER — DEXTROSE 5 % IV SOLN
1.0000 g | Freq: Once | INTRAVENOUS | Status: AC
  Administered 2012-12-26: 1 g via INTRAVENOUS
  Filled 2012-12-26: qty 10

## 2012-12-26 NOTE — ED Notes (Signed)
Pt brought to ED via [facility] from [facility] assisted living facility.  Pt PCP Dr. Nehemiah Settle office called pt today and told her to come to ED due to abnormal renal lab results.  Pt complaining of weakness for the past month, family reports pt has had decreased mobility for the past month as well.  Pt currently on antibiotics for UTI at present.  Alert and oriented X4, will continue to monitor pt.

## 2012-12-26 NOTE — ED Notes (Signed)
Dr. Karma Ganja informed of pt Sodium level.

## 2012-12-26 NOTE — ED Provider Notes (Signed)
History     CSN: [id number]  Arrival date & time 12/26/12  4098   First MD Initiated Contact with Patient 12/26/12 1836      Chief Complaint  Patient presents with  . Abnormal Lab    (Consider location/radiation/quality/duration/timing/severity/associated sxs/prior treatment) HPI A LEVEL 5 CAVEAT PERTAINS DUE TO ALTERED MENTAL STATUS Pt presents with c/o decreased mental status and was advised to come to the ED by her PMD today due to abnormal renal labs.  Per son, pt was diagnosed with renal stone approx 1 week ago.  Was placed on pain medications.  Since then she has been more somnolent and weaker than her usual state, he says she usually can do all of her daily activities for herself but has been very fatigued and sleeping more than usual.  Pt saw urology in followup and was started on antibiotics for UTI at that time- son not sure which antibiotic.  Saw her PMD today and was advised to come to the ED due to abnormal labs.     Past Medical History  Diagnosis Date  . Tremors of nervous system   . Hypertension   . Diabetes mellitus   . High cholesterol   . Coronary artery disease   . Gout 2014  . Arthritis     right shoulder    Past Surgical History  Procedure Laterality Date  . Abdominal hysterectomy    . Hand surgery      trigger finger and carpal tunnel  . Cardiac catheterization  1999    stent to LAD    Family History  Problem Relation Age of Onset  . Rheum arthritis Mother   . Lymphoma Mother   . Hypertension Mother   . Diabetes type II Father   . Stroke Sister   . Diabetes type II Brother   . Hypertension Daughter   . Hypertension Son   . Diabetes type II Son     History  Substance Use Topics  . Smoking status: Never Smoker   . Smokeless tobacco: Not on file  . Alcohol Use: No    OB History   Grav Para Term Preterm Abortions TAB SAB Ect Mult Living                  Review of Systems UNABLE TO OBTAIN ROS DUE TO LEVEL 5 CAVEAT  Allergies   Inderal  Home Medications   No current outpatient prescriptions on file.  BP 136/55  Pulse 87  Temp(Src) 97.9 F (36.6 C) (Oral)  Resp 22  Ht 5\' 2"  (1.575 m)  Wt 140 lb 14 oz (63.9 kg)  BMI 25.76 kg/m2  SpO2 100% Vitals reviewed Physical Exam Physical Examination: General appearance - alert, well appearing, and in no distress Mental status - alert, oriented to person, place, and time Eyes - pupils equal and reactive, extraocular eye movements intact Mouth - mucous membranes moist, pharynx normal without lesions Chest - clear to auscultation, no wheezes, rales or rhonchi, symmetric air entry Heart - normal rate, regular rhythm, normal S1, S2, no murmurs, rubs, clicks or gallops Abdomen - soft, nontender, nondistended, no masses or organomegaly Neurological - alert, oriented, normal speech, no focal findings or movement disorder noted Extremities - peripheral pulses normal, no pedal edema, no clubbing or cyanosis Skin - normal coloration and turgor, no rashes, no suspicious skin lesions noted  ED Course  Procedures (including critical care time)   Date: 12/26/2012  Rate: 84  Rhythm: normal sinus rhythm  QRS  Axis: normal  Intervals: normal  ST/T Wave abnormalities: nonspecific ST/T changes  Conduction Disutrbances:right bundle branch block and left posterior fascicular block  Narrative Interpretation:   Old EKG Reviewed: no significant changes compared to prior ekg of 07/25/12   11:17 PM d/w Dr. Adela Glimpse, hospitalist, she will see patient in the ED.  CT scan ordered to assess stone.  Updated family at bedside about findings and plan.   12:32 AM stone is present, has migrated distally- moderate hydronephrosis and perinephric stranding still present.  D/w Dr. Retta Diones, he will come see patient in the ED.  [facility] will admit to step down and has seen patient in the ED as well.   Labs Reviewed  CBC WITH DIFFERENTIAL - Abnormal; Notable for the following:    WBC 12.9 (*)     RBC 3.27 (*)    Hemoglobin 10.3 (*)    HCT 28.7 (*)    Neutrophils Relative 92 (*)    Neutro Abs 11.8 (*)    Lymphocytes Relative 4 (*)    Lymphs Abs 0.5 (*)    All other components within normal limits  COMPREHENSIVE METABOLIC PANEL - Abnormal; Notable for the following:    Sodium 119 (*)    Chloride 85 (*)    CO2 17 (*)    Glucose, Bld 209 (*)    BUN 56 (*)    Creatinine, Ser 4.69 (*)    Albumin 2.5 (*)    GFR calc non Af Amer 8 (*)    GFR calc Af Amer 9 (*)    All other components within normal limits  PRO B NATRIURETIC PEPTIDE - Abnormal; Notable for the following:    Pro B Natriuretic peptide (BNP) 2178.0 (*)    All other components within normal limits  URINALYSIS, ROUTINE W REFLEX MICROSCOPIC - Abnormal; Notable for the following:    APPearance CLOUDY (*)    Hgb urine dipstick LARGE (*)    Protein, ur 100 (*)    Nitrite POSITIVE (*)    Leukocytes, UA MODERATE (*)    All other components within normal limits  URINE MICROSCOPIC-ADD ON - Abnormal; Notable for the following:    Bacteria, UA MANY (*)    Casts HYALINE CASTS (*)    All other components within normal limits  GLUCOSE, CAPILLARY - Abnormal; Notable for the following:    Glucose-Capillary 149 (*)    All other components within normal limits  HEMOGLOBIN A1C - Abnormal; Notable for the following:    Hemoglobin A1C 7.2 (*)    Mean Plasma Glucose 160 (*)    All other components within normal limits  TSH - Abnormal; Notable for the following:    TSH 7.382 (*)    All other components within normal limits  COMPREHENSIVE METABOLIC PANEL - Abnormal; Notable for the following:    Sodium 124 (*)    Chloride 91 (*)    CO2 17 (*)    Glucose, Bld 128 (*)    BUN 64 (*)    Creatinine, Ser 5.19 (*)    Albumin 2.3 (*)    Total Bilirubin 0.2 (*)    GFR calc non Af Amer 7 (*)    GFR calc Af Amer 8 (*)    All other components within normal limits  CBC - Abnormal; Notable for the following:    WBC 12.8 (*)    RBC 3.24  (*)    Hemoglobin 10.4 (*)    HCT 28.8 (*)    MCHC 36.1 (*)  Platelets 149 (*)    All other components within normal limits  GLUCOSE, CAPILLARY - Abnormal; Notable for the following:    Glucose-Capillary 152 (*)    All other components within normal limits  GLUCOSE, CAPILLARY - Abnormal; Notable for the following:    Glucose-Capillary 117 (*)    All other components within normal limits  GLUCOSE, CAPILLARY - Abnormal; Notable for the following:    Glucose-Capillary 100 (*)    All other components within normal limits  GLUCOSE, CAPILLARY - Abnormal; Notable for the following:    Glucose-Capillary 154 (*)    All other components within normal limits  MRSA PCR SCREENING  URINE CULTURE  CULTURE, BLOOD (ROUTINE X 2)  CULTURE, BLOOD (ROUTINE X 2)  MAGNESIUM  PHOSPHORUS  LACTIC ACID, PLASMA  SODIUM, URINE, RANDOM  CREATININE, URINE, RANDOM  OSMOLALITY, URINE  BASIC METABOLIC PANEL  CBC   Ct Abdomen Pelvis Wo Contrast  12/27/2012  *RADIOLOGY REPORT*  Clinical Data: Urinary tract infection.  Urolithiasis.Renal insufficiency.  CT ABDOMEN AND PELVIS WITHOUT CONTRAST  Technique:  Multidetector CT imaging of the abdomen and pelvis was performed following the standard protocol without intravenous contrast.  Comparison: 12/21/2012  Findings: Mild increase in left-sided hydronephrosis and perinephric stranding are seen.  Increased dilatation of left ureter is seen and the previously noted calculus near the left ureteral pelvic junction has migrated distally to the ureterovesicle junction.  This measures approximately 4 mm.  An air- fluid level is seen in the urinary bladder likely from recent bladder catheterization.  No evidence of right-sided ureteral calculus or hydronephrosis.  Prior hysterectomy noted.  The other abdominal parenchymal organs show no significant abnormality.  Small low attenuation lesions in the right hepatic lobe are stable likely representing tiny benign cysts or hemangiomas.   Gallbladder is unremarkable.  No soft tissue masses or lymphadenopathy identified.  No evidence of dilated bowel loops.  IMPRESSION:  4 mm left ureteral calculus shows distal migration, now 1 year the left ureterovesicle junction.  Mild increase in moderate left hydronephrosis and perinephric stranding.   Original Report Authenticated By: Myles Rosenthal, M.D.    Dg Chest 2 View  12/26/2012  *RADIOLOGY REPORT*  Clinical Data: Weakness, abnormal labs  CHEST - 2 VIEW  Comparison: None.  Findings: Lungs are clear. No pleural effusion or pneumothorax.  Mild cardiomegaly.  Mild degenerative changes of the visualized thoracolumbar spine.  Vascular calcifications.  IMPRESSION: No evidence of acute cardiopulmonary disease.   Original Report Authenticated By: Charline Bills, M.D.    Ct Head Wo Contrast  12/26/2012  *RADIOLOGY REPORT*  Clinical Data: 79 year old female with weakness.  Recent urinary tract infection.  CT HEAD WITHOUT CONTRAST  Technique:  Contiguous axial images were obtained from the base of the skull through the vertex without contrast.  Comparison: 07/25/2012 and earlier.  Findings: Visualized paranasal sinuses and mastoids are clear.  No acute osseous abnormality identified.  Visualized orbits and scalp soft tissues are within normal limits.  Calcified atherosclerosis at the skull base.  Cerebral volume is stable and normal for age.  No ventriculomegaly. No midline shift, mass effect, or evidence of mass lesion.  Gray-white matter differentiation is within normal limits throughout the brain.  No evidence of cortically based acute infarction identified.  No suspicious intracranial vascular hyperdensity. No acute intracranial hemorrhage identified.  IMPRESSION: Stable and normal for age noncontrast CT appearance of the brain.   Original Report Authenticated By: Erskine Speed, M.D.      1. Altered mental  status   2. Renal failure   3. Urinary tract infection   4. Acute renal failure   5. Diabetes  mellitus   6. Hyponatremia   7. Hydronephrosis   8. Hypertension   9. Kidney stone       MDM  Pt presenting with decreased mental status, diagnosed with ureteral stone on 12/21/12 and has been having decreased mental status worsening since that time.  Now in renal failure, potassium is normal.  Head CT is reassuring.  Renal failure and altered mental status are likley multifactorial- sedation from pain meds, dehydration.  Also evidence of UTI.  Abdominal CT scan obtained to see if stone has passed.  D/w [facility] for admission.  Pt started on rocephin and IV hydration in the ED.  Family updated at the bedside about findings and plan for admission.         Ethelda Chick, MD 12/27/12 1758

## 2012-12-27 ENCOUNTER — Inpatient Hospital Stay (HOSPITAL_COMMUNITY): Payer: Medicare Other | Admitting: Certified Registered"

## 2012-12-27 ENCOUNTER — Encounter (HOSPITAL_COMMUNITY): Payer: Self-pay | Admitting: Certified Registered"

## 2012-12-27 ENCOUNTER — Encounter (HOSPITAL_COMMUNITY)
Admission: EM | Disposition: A | Discharge status: Skilled Nursing Facility | Payer: Self-pay | Source: Home / Self Care | Attending: Internal Medicine

## 2012-12-27 ENCOUNTER — Inpatient Hospital Stay (HOSPITAL_COMMUNITY): Payer: Medicare Other

## 2012-12-27 DIAGNOSIS — I10 Essential (primary) hypertension: Secondary | ICD-10-CM

## 2012-12-27 DIAGNOSIS — E871 Hypo-osmolality and hyponatremia: Secondary | ICD-10-CM | POA: Diagnosis present

## 2012-12-27 DIAGNOSIS — R4182 Altered mental status, unspecified: Secondary | ICD-10-CM

## 2012-12-27 DIAGNOSIS — N1330 Unspecified hydronephrosis: Secondary | ICD-10-CM | POA: Diagnosis present

## 2012-12-27 DIAGNOSIS — N179 Acute kidney failure, unspecified: Secondary | ICD-10-CM

## 2012-12-27 DIAGNOSIS — N390 Urinary tract infection, site not specified: Secondary | ICD-10-CM

## 2012-12-27 DIAGNOSIS — E119 Type 2 diabetes mellitus without complications: Secondary | ICD-10-CM

## 2012-12-27 DIAGNOSIS — N200 Calculus of kidney: Secondary | ICD-10-CM

## 2012-12-27 HISTORY — PX: CYSTOSCOPY WITH STENT PLACEMENT: SHX5790

## 2012-12-27 LAB — COMPREHENSIVE METABOLIC PANEL
AST: 26 U/L (ref 0–37)
Albumin: 2.3 g/dL — ABNORMAL LOW (ref 3.5–5.2)
BUN: 64 mg/dL — ABNORMAL HIGH (ref 6–23)
Creatinine, Ser: 5.19 mg/dL — ABNORMAL HIGH (ref 0.50–1.10)
Potassium: 4.5 mEq/L (ref 3.5–5.1)
Total Protein: 6.9 g/dL (ref 6.0–8.3)

## 2012-12-27 LAB — CBC
HCT: 28.8 % — ABNORMAL LOW (ref 36.0–46.0)
MCV: 88.9 fL (ref 78.0–100.0)
RBC: 3.24 MIL/uL — ABNORMAL LOW (ref 3.87–5.11)
WBC: 12.8 10*3/uL — ABNORMAL HIGH (ref 4.0–10.5)

## 2012-12-27 LAB — HEMOGLOBIN A1C: Hgb A1c MFr Bld: 7.2 % — ABNORMAL HIGH (ref <5.7)

## 2012-12-27 LAB — OSMOLALITY, URINE: Osmolality, Ur: 196 mOsm/kg — ABNORMAL LOW (ref 390–1090)

## 2012-12-27 LAB — PHOSPHORUS: Phosphorus: 4.3 mg/dL (ref 2.3–4.6)

## 2012-12-27 LAB — TSH: TSH: 7.382 u[IU]/mL — ABNORMAL HIGH (ref 0.350–4.500)

## 2012-12-27 LAB — GLUCOSE, CAPILLARY
Glucose-Capillary: 152 mg/dL — ABNORMAL HIGH (ref 70–99)
Glucose-Capillary: 117 mg/dL — ABNORMAL HIGH (ref 70–99)
Glucose-Capillary: 158 mg/dL — ABNORMAL HIGH (ref 70–99)

## 2012-12-27 LAB — LACTIC ACID, PLASMA: Lactic Acid, Venous: 1.3 mmol/L (ref 0.5–2.2)

## 2012-12-27 LAB — MAGNESIUM: Magnesium: 1.9 mg/dL (ref 1.5–2.5)

## 2012-12-27 LAB — MRSA PCR SCREENING: MRSA by PCR: NEGATIVE

## 2012-12-27 SURGERY — CYSTOSCOPY, WITH STENT INSERTION
Anesthesia: General | Site: Ureter | Laterality: Left | Wound class: Contaminated

## 2012-12-27 MED ORDER — SODIUM CHLORIDE 0.9 % IV SOLN
INTRAVENOUS | Status: DC | PRN
  Administered 2012-12-27 (×2): via INTRAVENOUS

## 2012-12-27 MED ORDER — ONDANSETRON HCL 4 MG/2ML IJ SOLN: 4.0000 mg | Freq: Once | INTRAMUSCULAR | Status: DC | PRN

## 2012-12-27 MED ORDER — PANTOPRAZOLE SODIUM 40 MG IV SOLR
40.0000 mg | Freq: Every day | INTRAVENOUS | Status: DC
  Administered 2012-12-27: 40 mg via INTRAVENOUS
  Filled 2012-12-27 (×2): qty 40

## 2012-12-27 MED ORDER — HYDROCODONE-ACETAMINOPHEN 5-325 MG PO TABS
1.0000 | ORAL_TABLET | ORAL | Status: DC | PRN
  Administered 2012-12-28: 1 via ORAL
  Filled 2012-12-27: qty 1

## 2012-12-27 MED ORDER — SODIUM CHLORIDE 0.9 % IJ SOLN
3.0000 mL | Freq: Two times a day (BID) | INTRAMUSCULAR | Status: DC
  Administered 2012-12-27 – 2013-01-03 (×10): 3 mL via INTRAVENOUS

## 2012-12-27 MED ORDER — METOPROLOL TARTRATE 1 MG/ML IV SOLN
INTRAVENOUS | Status: DC | PRN
  Administered 2012-12-27: 2 mg via INTRAVENOUS

## 2012-12-27 MED ORDER — MORPHINE SULFATE 2 MG/ML IJ SOLN: 1.0000 mg | INTRAMUSCULAR | Status: DC | PRN

## 2012-12-27 MED ORDER — ACETAMINOPHEN 10 MG/ML IV SOLN: 1000.0000 mg | Freq: Once | INTRAVENOUS | Status: DC | PRN

## 2012-12-27 MED ORDER — ACETAMINOPHEN 325 MG PO TABS
650.0000 mg | ORAL_TABLET | Freq: Four times a day (QID) | ORAL | Status: DC | PRN
  Administered 2012-12-27 – 2013-01-02 (×3): 650 mg via ORAL
  Filled 2012-12-27 (×3): qty 2

## 2012-12-27 MED ORDER — PROPOFOL 10 MG/ML IV BOLUS
INTRAVENOUS | Status: DC | PRN
  Administered 2012-12-27: 100 mg via INTRAVENOUS

## 2012-12-27 MED ORDER — STERILE WATER FOR IRRIGATION IR SOLN
Status: DC | PRN
  Administered 2012-12-27: 1000 mL

## 2012-12-27 MED ORDER — ONDANSETRON HCL 4 MG/2ML IJ SOLN: 4.0000 mg | Freq: Four times a day (QID) | INTRAMUSCULAR | Status: DC | PRN

## 2012-12-27 MED ORDER — SODIUM CHLORIDE 0.9 % IV SOLN
INTRAVENOUS | Status: AC
  Administered 2012-12-27: 1000 mL via INTRAVENOUS

## 2012-12-27 MED ORDER — ACETAMINOPHEN 650 MG RE SUPP: 650.0000 mg | Freq: Four times a day (QID) | RECTAL | Status: DC | PRN

## 2012-12-27 MED ORDER — PIPERACILLIN-TAZOBACTAM 3.375 G IVPB 30 MIN
3.3750 g | Freq: Once | INTRAVENOUS | Status: AC
  Administered 2012-12-27: 3.375 g via INTRAVENOUS
  Filled 2012-12-27: qty 50

## 2012-12-27 MED ORDER — INSULIN ASPART 100 UNIT/ML ~~LOC~~ SOLN
0.0000 [IU] | SUBCUTANEOUS | Status: DC
  Administered 2012-12-27 – 2012-12-28 (×4): 2 [IU] via SUBCUTANEOUS
  Administered 2012-12-29: 1 [IU] via SUBCUTANEOUS
  Administered 2012-12-29: 5 [IU] via SUBCUTANEOUS
  Administered 2012-12-29 (×3): 1 [IU] via SUBCUTANEOUS
  Administered 2012-12-30: 5 [IU] via SUBCUTANEOUS
  Administered 2012-12-30: 2 [IU] via SUBCUTANEOUS
  Administered 2012-12-30: 1 [IU] via SUBCUTANEOUS
  Administered 2012-12-30 – 2012-12-31 (×2): 2 [IU] via SUBCUTANEOUS
  Administered 2012-12-31: 1 [IU] via SUBCUTANEOUS
  Administered 2012-12-31 (×3): 3 [IU] via SUBCUTANEOUS
  Administered 2012-12-31: 2 [IU] via SUBCUTANEOUS
  Administered 2013-01-01: 5 [IU] via SUBCUTANEOUS
  Administered 2013-01-01: 2 [IU] via SUBCUTANEOUS
  Administered 2013-01-01: 3 [IU] via SUBCUTANEOUS
  Administered 2013-01-01: 1 [IU] via SUBCUTANEOUS
  Administered 2013-01-01: 5 [IU] via SUBCUTANEOUS
  Administered 2013-01-01 – 2013-01-02 (×3): 2 [IU] via SUBCUTANEOUS
  Administered 2013-01-02: 3 [IU] via SUBCUTANEOUS
  Administered 2013-01-02 – 2013-01-03 (×4): 1 [IU] via SUBCUTANEOUS
  Administered 2013-01-03: 2 [IU] via SUBCUTANEOUS

## 2012-12-27 MED ORDER — SODIUM CHLORIDE 0.45 % IV SOLN
INTRAVENOUS | Status: DC
  Administered 2012-12-27: 1000 mL via INTRAVENOUS
  Administered 2012-12-28 – 2013-01-01 (×3): via INTRAVENOUS

## 2012-12-27 MED ORDER — TAMSULOSIN HCL 0.4 MG PO CAPS
0.4000 mg | ORAL_CAPSULE | Freq: Every day | ORAL | Status: DC
  Administered 2012-12-27 – 2013-01-02 (×6): 0.4 mg via ORAL
  Filled 2012-12-27 (×8): qty 1

## 2012-12-27 MED ORDER — PIPERACILLIN-TAZOBACTAM IN DEX 2-0.25 GM/50ML IV SOLN
2.2500 g | Freq: Four times a day (QID) | INTRAVENOUS | Status: DC
  Administered 2012-12-27 – 2012-12-29 (×7): 2.25 g via INTRAVENOUS
  Filled 2012-12-27 (×13): qty 50

## 2012-12-27 MED ORDER — FENTANYL CITRATE 0.05 MG/ML IJ SOLN: 25.0000 ug | INTRAMUSCULAR | Status: DC | PRN

## 2012-12-27 MED ORDER — DOCUSATE SODIUM 100 MG PO CAPS
100.0000 mg | ORAL_CAPSULE | Freq: Two times a day (BID) | ORAL | Status: DC
  Administered 2012-12-27 – 2013-01-03 (×13): 100 mg via ORAL
  Filled 2012-12-27 (×17): qty 1

## 2012-12-27 MED ORDER — ONDANSETRON HCL 4 MG/2ML IJ SOLN
INTRAMUSCULAR | Status: DC | PRN
  Administered 2012-12-27: 4 mg via INTRAVENOUS

## 2012-12-27 MED ORDER — ONDANSETRON HCL 4 MG PO TABS: 4.0000 mg | ORAL_TABLET | Freq: Four times a day (QID) | ORAL | Status: DC | PRN

## 2012-12-27 MED ORDER — PIPERACILLIN-TAZOBACTAM 3.375 G IVPB
3.3750 g | Freq: Three times a day (TID) | INTRAVENOUS | Status: DC
  Administered 2012-12-27: 3.375 g via INTRAVENOUS
  Filled 2012-12-27 (×3): qty 50

## 2012-12-27 MED ORDER — METOPROLOL TARTRATE 12.5 MG HALF TABLET
12.5000 mg | ORAL_TABLET | Freq: Two times a day (BID) | ORAL | Status: DC
  Administered 2012-12-27 – 2012-12-30 (×7): 12.5 mg via ORAL
  Filled 2012-12-27 (×10): qty 1

## 2012-12-27 MED ORDER — LIDOCAINE HCL (CARDIAC) 20 MG/ML IV SOLN
INTRAVENOUS | Status: DC | PRN
  Administered 2012-12-27: 40 mg via INTRAVENOUS

## 2012-12-27 SURGICAL SUPPLY — 27 items
ADAPTER CATH URET PLST 4-6FR (CATHETERS) ×1 IMPLANT
ADPR CATH URET STRL DISP 4-6FR (CATHETERS) ×1
APL SKNCLS STERI-STRIP NONHPOA (GAUZE/BANDAGES/DRESSINGS)
BAG URINE DRAINAGE (UROLOGICAL SUPPLIES) ×2 IMPLANT
BAG URO CATCHER STRL LF (DRAPE) ×2 IMPLANT
BENZOIN TINCTURE PRP APPL 2/3 (GAUZE/BANDAGES/DRESSINGS) IMPLANT
BUCKET BIOHAZARD WASTE 5 GAL (MISCELLANEOUS) ×2 IMPLANT
CATH FOLEY 2WAY SLVR  5CC 16FR (CATHETERS) ×1
CATH FOLEY 2WAY SLVR 5CC 16FR (CATHETERS) IMPLANT
CATH URET 5FR 28IN CONE TIP (BALLOONS)
CATH URET 5FR 70CM CONE TIP (BALLOONS) IMPLANT
CLOTH BEACON ORANGE TIMEOUT ST (SAFETY) ×2 IMPLANT
DRAPE CAMERA CLOSED 9X96 (DRAPES) ×4 IMPLANT
GOWN PREVENTION PLUS XLARGE (GOWN DISPOSABLE) ×3 IMPLANT
GOWN STRL NON-REIN LRG LVL3 (GOWN DISPOSABLE) ×1 IMPLANT
GUIDEWIRE COOK  .035 (WIRE) IMPLANT
KIT ROOM TURNOVER OR (KITS) ×2 IMPLANT
NS IRRIG 1000ML POUR BTL (IV SOLUTION) ×4 IMPLANT
PACK CYSTOSCOPY (CUSTOM PROCEDURE TRAY) ×2 IMPLANT
PAD ARMBOARD 7.5X6 YLW CONV (MISCELLANEOUS) ×4 IMPLANT
PLUG CATH AND CAP STER (CATHETERS) ×1 IMPLANT
STENT URET 6FRX24 CONTOUR (STENTS) ×1 IMPLANT
SYR CONTROL 10ML LL (SYRINGE) ×2 IMPLANT
SYRINGE TOOMEY DISP (SYRINGE) ×2 IMPLANT
UNDERPAD 30X30 INCONTINENT (UNDERPADS AND DIAPERS) ×2 IMPLANT
WATER STERILE IRR 1000ML POUR (IV SOLUTION) ×2 IMPLANT
WIRE COONS/BENSON .038X145CM (WIRE) ×1 IMPLANT

## 2012-12-27 NOTE — Anesthesia Preprocedure Evaluation (Addendum)
Anesthesia Evaluation  Patient identified by MRN, date of birth, ID band Patient awake    Reviewed: Allergy & Precautions, H&P , NPO status , Patient's Chart, lab work & pertinent test results  Airway Mallampati: II      Dental  (+) Teeth Intact and Dental Advisory Given   Pulmonary  breath sounds clear to auscultation        Cardiovascular Rhythm:Regular Rate:Normal     Neuro/Psych    GI/Hepatic   Endo/Other    Renal/GU      Musculoskeletal   Abdominal   Peds  Hematology   Anesthesia Other Findings   Reproductive/Obstetrics                           Anesthesia Physical Anesthesia Plan  ASA: IV and emergent  Anesthesia Plan: General   Post-op Pain Management:    Induction: Intravenous  Airway Management Planned: LMA  Additional Equipment:   Intra-op Plan:   Post-operative Plan:   Informed Consent: I have reviewed the patients History and Physical, chart, labs and discussed the procedure including the risks, benefits and alternatives for the proposed anesthesia with the patient or authorized representative who has indicated his/her understanding and acceptance.   Dental advisory given  Plan Discussed with: CRNA, Surgeon and Anesthesiologist  Anesthesia Plan Comments: (L ureteral kidney stone with hydronephrosis Acute renal failure Cr 4.69 K 4.7 htn  type 2 dm 209 Tremors Hyponatremia Na 119   Plan GA with LMA   )       Anesthesia Quick Evaluation

## 2012-12-27 NOTE — Op Note (Signed)
Preoperative diagnosis: Left distal ureteral stone with hydronephrosis and acute renal failure  Postoperative diagnosis: Same   Procedure: Cystoscopy, left double-J stent placement (6 [place] by 24 cm and (    Surgeon: Bertram Millard. Kyung Muto, M.D.   Anesthesia: Gen.   Complications: None  Specimen(s): None  Drain(s): Previously mentioned stent  Indications: 79 year-old female with acute renal failure, left hydronephrosis and an obstructing left distal ureteral stone. She presents at this time for emergent stent placement.    Technique and findings: The patient was properly identified in the holding area, surgical side marked. She received Rocephin in the emergency room. She was taken the operating room where general anesthetic was administered with the LMA. She was placed in the dorsolithotomy position. Genitalia and perineum were prepped and draped, time out was performed.  A 22 French panendoscope was advanced to the bladder which was inspected circumferentially after drainage of the purulent urine. No bladder lesions were seen. The left ureteral orifice was cannulated with a sensor-tip guidewire, with fluoroscopic guidance following it to the left renal pelvis were curl was seen. Over top of this, a 6 [place], 24 cm double-J stent was placed using cystoscopic and fluoroscopic guidance. Following removal of the wire, good proximal and distal curls were seen. The bladder was drained, a Foley catheter placed, and the procedure terminated. She was taken to PACU in stable condition.

## 2012-12-27 NOTE — Progress Notes (Signed)
Nutrition Brief Note  Patient identified on the Malnutrition Screening Tool (MST) Report  Body mass index is 25.76 kg/(m^2). Patient meets criteria for overweight based on current BMI.   Current diet order is full liquid, patient is consuming approximately 100% of meals at this time. Labs and medications reviewed, noted pt with elevated renal labs - pt with acute renal insufficiency secondary to obstruction relieved with stent that was placed today. Met with pt and family who report pt eating well PTA, 3 meals/day with good appetite and stable weight. Pt denies any problems chewing and swallowing. Pt had consumed 100% of tomato soup on tray during RD visit.   No nutrition interventions warranted at this time. If nutrition issues arise, please consult RD.   Levon Hedger MS, RD, LDN [phone] Weekend/After Hours Pager

## 2012-12-27 NOTE — Progress Notes (Signed)
Day of Surgery Subjective: Patient reports that she feels weak, but there is no pain.  Objective: Vital signs in last 24 hours: Temp:  [97.4 F (36.3 C)-98.8 F (37.1 C)] 97.7 F (36.5 C) (04/12 0800) Pulse Rate:  [81-100] 82 (04/12 0800) Resp:  [15-31] 28 (04/12 0800) BP: (92-135)/(44-84) 92/46 mmHg (04/12 0800) SpO2:  [87 %-100 %] 95 % (04/12 0800) Weight:  [63.9 kg (140 lb 14 oz)] 63.9 kg (140 lb 14 oz) (04/12 0418)  Intake/Output from previous day: 04/11 0701 - 04/12 0700 In: 900 [I.V.:900] Out: -  Intake/Output this shift: Total I/O In: 100 [I.V.:100] Out: -   Physical Exam:  Constitutional: Vital signs reviewed. WD WN in NAD  she is alert, answers questions appropriately Eyes: PERRL, No scleral icterus.   Pulmonary/Chest: Normal effort Extremities: No cyanosis or edema   Lab Results:  Recent Labs  12/26/12 1839 12/27/12 0831  HGB 10.3* 10.4*  HCT 28.7* 28.8*   BMET  Recent Labs  12/26/12 1839 12/27/12 0831  NA 119* 124*  K 4.7 4.5  CL 85* 91*  CO2 17* 17*  GLUCOSE 209* 128*  BUN 56* 64*  CREATININE 4.69* 5.19*  CALCIUM 9.0 8.4   No results found for this basename: LABPT, INR,  in the last 72 hours No results found for this basename: LABURIN,  in the last 72 hours Results for orders placed during the hospital encounter of 12/26/12  MRSA PCR SCREENING     Status: None   Collection Time    12/27/12  4:26 AM      Result Value Range Status   MRSA by PCR NEGATIVE  NEGATIVE Final   Comment:            The GeneXpert MRSA Assay (FDA     approved for NASAL specimens     only), is one component of a     comprehensive MRSA colonization     surveillance program. It is not     intended to diagnose MRSA     infection nor to guide or     monitor treatment for     MRSA infections.    Studies/Results: Ct Abdomen Pelvis Wo Contrast  12/27/2012  *RADIOLOGY REPORT*  Clinical Data: Urinary tract infection.  Urolithiasis.Renal insufficiency.  CT ABDOMEN AND  PELVIS WITHOUT CONTRAST  Technique:  Multidetector CT imaging of the abdomen and pelvis was performed following the standard protocol without intravenous contrast.  Comparison: 12/21/2012  Findings: Mild increase in left-sided hydronephrosis and perinephric stranding are seen.  Increased dilatation of left ureter is seen and the previously noted calculus near the left ureteral pelvic junction has migrated distally to the ureterovesicle junction.  This measures approximately 4 mm.  An air- fluid level is seen in the urinary bladder likely from recent bladder catheterization.  No evidence of right-sided ureteral calculus or hydronephrosis.  Prior hysterectomy noted.  The other abdominal parenchymal organs show no significant abnormality.  Small low attenuation lesions in the right hepatic lobe are stable likely representing tiny benign cysts or hemangiomas.  Gallbladder is unremarkable.  No soft tissue masses or lymphadenopathy identified.  No evidence of dilated bowel loops.  IMPRESSION:  4 mm left ureteral calculus shows distal migration, now 1 year the left ureterovesicle junction.  Mild increase in moderate left hydronephrosis and perinephric stranding.   Original Report Authenticated By: Myles Rosenthal, M.D.    Dg Chest 2 View  12/26/2012  *RADIOLOGY REPORT*  Clinical Data: Weakness, abnormal labs  CHEST -  2 VIEW  Comparison: None.  Findings: Lungs are clear. No pleural effusion or pneumothorax.  Mild cardiomegaly.  Mild degenerative changes of the visualized thoracolumbar spine.  Vascular calcifications.  IMPRESSION: No evidence of acute cardiopulmonary disease.   Original Report Authenticated By: Charline Bills, M.D.    Ct Head Wo Contrast  12/26/2012  *RADIOLOGY REPORT*  Clinical Data: 79 year old female with weakness.  Recent urinary tract infection.  CT HEAD WITHOUT CONTRAST  Technique:  Contiguous axial images were obtained from the base of the skull through the vertex without contrast.  Comparison:  07/25/2012 and earlier.  Findings: Visualized paranasal sinuses and mastoids are clear.  No acute osseous abnormality identified.  Visualized orbits and scalp soft tissues are within normal limits.  Calcified atherosclerosis at the skull base.  Cerebral volume is stable and normal for age.  No ventriculomegaly. No midline shift, mass effect, or evidence of mass lesion.  Gray-white matter differentiation is within normal limits throughout the brain.  No evidence of cortically based acute infarction identified.  No suspicious intracranial vascular hyperdensity. No acute intracranial hemorrhage identified.  IMPRESSION: Stable and normal for age noncontrast CT appearance of the brain.   Original Report Authenticated By: Erskine Speed, M.D.     Assessment/Plan:  Hospital day #1. She is about 8 hours out from emergent stenting of left ureter for stone with hydronephrosis. She has renal insufficiency, most likely from combination of infection, prerenal azotemia and obstruction. She seems stable.  She will need a steady course of antibiotics, eventually tailored to her organism when it is identified and sensitivities available. She will eventually need ureteroscopy and stone extraction, as an outpatient more than likely when she has fully recovered.  LOS: 1 day   Chelsea Aus 12/27/2012, 10:55 AM

## 2012-12-27 NOTE — Consult Note (Signed)
Urology Consult   Physician requesting consult: Linker   Reason for consult: Kidney stone  History of Present Illness: Gloria Mcintosh is a 79 y.o. female who presented to the emergency room earlier this evening with an elevated creatinine and change in mental status. She presented initially to [facility] about a week ago with left flank pain, and was found to have a left distal ureteral stone. With pain management, she left the emergency room a few hours later relatively pain-free. Bacteria were present in her urine, but nitrite and leukocytes were negative. Over the past day or 2 she has been feeling worse, has had altered mental status, worsening pain, and lethargy. She presented to see Dr. Nehemiah Settle for a check earlier today, he was found to have a creatinine of 4. Was recommended to go the emergency room for further management. She denies prior history of kidney stones or severe urinary tract infections.  She denies a prior history of voiding or storage urinary symptoms, hematuria, UTIs, STDs, urolithiasis, GU malignancy/trauma/surgery.  Past Medical History  Diagnosis Date  . Tremors of nervous system   . Hypertension   . Diabetes mellitus   . High cholesterol   . Coronary artery disease   . Gout 2014    Past Surgical History  Procedure Laterality Date  . Abdominal hysterectomy    . Hand surgery      trigger finger and carpal tunnel  . Cardiac catheterization  1999    stent to LAD     Current Hospital Medications: Scheduled Meds: Continuous Infusions: PRN Meds:.    Allergies:  Allergies  Allergen Reactions  . Inderal (Propranolol Hcl) Rash    Family History  Problem Relation Age of Onset  . Rheum arthritis Mother   . Lymphoma Mother   . Hypertension Mother   . Diabetes type II Father   . Stroke Sister   . Diabetes type II Brother   . Hypertension Daughter   . Hypertension Son   . Diabetes type II Son     Social History:  reports that she has  never smoked. She does not have any smokeless tobacco history on file. She reports that she does not drink alcohol or use illicit drugs.  ROS: A complete review of systems was performed.  All systems are negative except for pertinent findings as noted.  Physical Exam:  Vital signs in last 24 hours: Temp:  [97.4 F (36.3 C)-98.4 F (36.9 C)] 97.4 F (36.3 C) (04/11 1838) Pulse Rate:  [83-86] 86 (04/11 2300) Resp:  [19-26] 21 (04/11 2300) BP: (113-130)/(47-70) 116/49 mmHg (04/11 2300) SpO2:  [95 %-99 %] 96 % (04/11 2300) General: Lethargic, but oriented. She is having a fair amount of pain. HEENT: Normocephalic, atraumatic Neck: No JVD or lymphadenopathy Cardiovascular: Regular rate and rhythm Lungs: Clear bilaterally Abdomen: Soft, left lower quadrant tenderness. No rebound or guarding. Abdomen is protuberant. Left distal ureteral stone with hydronephrosis Back: Mild left CVA tenderness noted Extremities: No edema Neurologic: Grossly intact  Laboratory Data:   Recent Labs  12/26/12 1839  WBC 12.9*  HGB 10.3*  HCT 28.7*  PLT 153     Recent Labs  12/26/12 1839  NA 119*  K 4.7  CL 85*  GLUCOSE 209*  BUN 56*  CALCIUM 9.0  CREATININE 4.69*     Results for orders placed during the hospital encounter of 12/26/12 (from the past 24 hour(s))  CBC WITH DIFFERENTIAL     Status: Abnormal   Collection  Time    12/26/12  6:39 PM      Result Value Range   WBC 12.9 (*) 4.0 - 10.5 K/uL   RBC 3.27 (*) 3.87 - 5.11 MIL/uL   Hemoglobin 10.3 (*) 12.0 - 15.0 g/dL   HCT 16.1 (*) 09.6 - 04.5 %   MCV 87.8  78.0 - 100.0 fL   MCH 31.5  26.0 - 34.0 pg   MCHC 35.9  30.0 - 36.0 g/dL   RDW 40.9  81.1 - 91.4 %   Platelets 153  150 - 400 K/uL   Neutrophils Relative 92 (*) 43 - 77 %   Neutro Abs 11.8 (*) 1.7 - 7.7 K/uL   Lymphocytes Relative 4 (*) 12 - 46 %   Lymphs Abs 0.5 (*) 0.7 - 4.0 K/uL   Monocytes Relative 4  3 - 12 %   Monocytes Absolute 0.5  0.1 - 1.0 K/uL   Eosinophils  Relative 0  0 - 5 %   Eosinophils Absolute 0.0  0.0 - 0.7 K/uL   Basophils Relative 0  0 - 1 %   Basophils Absolute 0.0  0.0 - 0.1 K/uL  COMPREHENSIVE METABOLIC PANEL     Status: Abnormal   Collection Time    12/26/12  6:39 PM      Result Value Range   Sodium 119 (*) 135 - 145 mEq/L   Potassium 4.7  3.5 - 5.1 mEq/L   Chloride 85 (*) 96 - 112 mEq/L   CO2 17 (*) 19 - 32 mEq/L   Glucose, Bld 209 (*) 70 - 99 mg/dL   BUN 56 (*) 6 - 23 mg/dL   Creatinine, Ser 7.82 (*) 0.50 - 1.10 mg/dL   Calcium 9.0  8.4 - 95.6 mg/dL   Total Protein 7.2  6.0 - 8.3 g/dL   Albumin 2.5 (*) 3.5 - 5.2 g/dL   AST 26  0 - 37 U/L   ALT 16  0 - 35 U/L   Alkaline Phosphatase 93  39 - 117 U/L   Total Bilirubin 0.3  0.3 - 1.2 mg/dL   GFR calc non Af Amer 8 (*) >90 mL/min   GFR calc Af Amer 9 (*) >90 mL/min  PRO B NATRIURETIC PEPTIDE     Status: Abnormal   Collection Time    12/26/12  6:40 PM      Result Value Range   Pro B Natriuretic peptide (BNP) 2178.0 (*) 0 - 450 pg/mL  URINALYSIS, ROUTINE W REFLEX MICROSCOPIC     Status: Abnormal   Collection Time    12/26/12  9:34 PM      Result Value Range   Color, Urine YELLOW  YELLOW   APPearance CLOUDY (*) CLEAR   Specific Gravity, Urine 1.015  1.005 - 1.030   pH 5.5  5.0 - 8.0   Glucose, UA NEGATIVE  NEGATIVE mg/dL   Hgb urine dipstick LARGE (*) NEGATIVE   Bilirubin Urine NEGATIVE  NEGATIVE   Ketones, ur NEGATIVE  NEGATIVE mg/dL   Protein, ur 213 (*) NEGATIVE mg/dL   Urobilinogen, UA 0.2  0.0 - 1.0 mg/dL   Nitrite POSITIVE (*) NEGATIVE   Leukocytes, UA MODERATE (*) NEGATIVE  URINE MICROSCOPIC-ADD ON     Status: Abnormal   Collection Time    12/26/12  9:34 PM      Result Value Range   Squamous Epithelial / LPF RARE  RARE   WBC, UA 21-50  <3 WBC/hpf   RBC / HPF 11-20  <3  RBC/hpf   Bacteria, UA MANY (*) RARE   Casts HYALINE CASTS (*) NEGATIVE   Recent Results (from the past 240 hour(s))  URINE CULTURE     Status: None   Collection Time    12/21/12   2:28 AM      Result Value Range Status   Specimen Description URINE, CATHETERIZED   Final   Special Requests NONE   Final   Culture  Setup Time 12/21/2012 15:51   Final   Colony Count >=100,000 COLONIES/ML   Final   Culture KLEBSIELLA PNEUMONIAE   Final   Report Status 12/23/2012 FINAL   Final   Organism ID, Bacteria KLEBSIELLA PNEUMONIAE   Final    Renal Function:  Recent Labs  12/21/12 0130 12/26/12 1839  CREATININE 1.30* 4.69*   The CrCl is unknown because both a height and weight (above a minimum accepted value) are required for this calculation.  Radiologic Imaging: Ct Abdomen Pelvis Wo Contrast  12/27/2012  *RADIOLOGY REPORT*  Clinical Data: Urinary tract infection.  Urolithiasis.Renal insufficiency.  CT ABDOMEN AND PELVIS WITHOUT CONTRAST  Technique:  Multidetector CT imaging of the abdomen and pelvis was performed following the standard protocol without intravenous contrast.  Comparison: 12/21/2012  Findings: Mild increase in left-sided hydronephrosis and perinephric stranding are seen.  Increased dilatation of left ureter is seen and the previously noted calculus near the left ureteral pelvic junction has migrated distally to the ureterovesicle junction.  This measures approximately 4 mm.  An air- fluid level is seen in the urinary bladder likely from recent bladder catheterization.  No evidence of right-sided ureteral calculus or hydronephrosis.  Prior hysterectomy noted.  The other abdominal parenchymal organs show no significant abnormality.  Small low attenuation lesions in the right hepatic lobe are stable likely representing tiny benign cysts or hemangiomas.  Gallbladder is unremarkable.  No soft tissue masses or lymphadenopathy identified.  No evidence of dilated bowel loops.  IMPRESSION:  4 mm left ureteral calculus shows distal migration, now 1 year the left ureterovesicle junction.  Mild increase in moderate left hydronephrosis and perinephric stranding.   Original Report  Authenticated By: Myles Rosenthal, M.D.    Dg Chest 2 View  12/26/2012  *RADIOLOGY REPORT*  Clinical Data: Weakness, abnormal labs  CHEST - 2 VIEW  Comparison: None.  Findings: Lungs are clear. No pleural effusion or pneumothorax.  Mild cardiomegaly.  Mild degenerative changes of the visualized thoracolumbar spine.  Vascular calcifications.  IMPRESSION: No evidence of acute cardiopulmonary disease.   Original Report Authenticated By: Charline Bills, M.D.    Ct Head Wo Contrast  12/26/2012  *RADIOLOGY REPORT*  Clinical Data: 79 year old female with weakness.  Recent urinary tract infection.  CT HEAD WITHOUT CONTRAST  Technique:  Contiguous axial images were obtained from the base of the skull through the vertex without contrast.  Comparison: 07/25/2012 and earlier.  Findings: Visualized paranasal sinuses and mastoids are clear.  No acute osseous abnormality identified.  Visualized orbits and scalp soft tissues are within normal limits.  Calcified atherosclerosis at the skull base.  Cerebral volume is stable and normal for age.  No ventriculomegaly. No midline shift, mass effect, or evidence of mass lesion.  Gray-white matter differentiation is within normal limits throughout the brain.  No evidence of cortically based acute infarction identified.  No suspicious intracranial vascular hyperdensity. No acute intracranial hemorrhage identified.  IMPRESSION: Stable and normal for age noncontrast CT appearance of the brain.   Original Report Authenticated By: Erskine Speed, M.D.  I independently reviewed the above imaging studies.  Impression/Assessment:  Left distal ureteral stone with hydronephrosis and acute renal insufficiency  Plan:  The patient will be admitted to the medical service for management of her renal insufficiency. I spoke with the patient and her family. She needs emergent left ureteral stent placement, followed by obvious medical management. We can place a stent with minimal anesthesia/mild  IV sedation.  She will be started on IV antibiotics. Following decompression of her left renal system, she will need antibiotic management, followed by a ventral stone extraction.

## 2012-12-27 NOTE — Anesthesia Postprocedure Evaluation (Signed)
  Anesthesia Post-op Note  Patient: Gloria Mcintosh  Procedure(s) Performed: Procedure(s): CYSTOSCOPY WITH Left JJ STENT PLACEMENT (Left)  Patient Location: PACU  Anesthesia Type:General  Level of Consciousness: awake, alert  and confused  Airway and Oxygen Therapy: Patient Spontanous Breathing and Patient connected to nasal cannula oxygen  Post-op Pain: none  Post-op Assessment: Post-op Vital signs reviewed, Patient's Cardiovascular Status Stable, Respiratory Function Stable and Pain level controlled  Post-op Vital Signs: stable  Complications: No apparent anesthesia complications

## 2012-12-27 NOTE — Transfer of Care (Signed)
Immediate Anesthesia Transfer of Care Note  Patient: Gloria Mcintosh  Procedure(s) Performed: Procedure(s): CYSTOSCOPY WITH Left JJ STENT PLACEMENT (Left)  Patient Location: PACU  Anesthesia Type:General  Level of Consciousness: awake, oriented and patient cooperative  Airway & Oxygen Therapy: Patient Spontanous Breathing and Patient connected to nasal cannula oxygen  Post-op Assessment: Report given to PACU RN, Post -op Vital signs reviewed and stable and Patient moving all extremities X 4  Post vital signs: Reviewed and stable  Complications: No apparent anesthesia complications

## 2012-12-27 NOTE — Progress Notes (Signed)
Patient noted on Zosyn 3.375g IV q 8 hrs.  Current estimated CrCl ~ 10 ml/min. UCx pending.  Plan: Will change Zosyn to 2.25g IV q 6 hrs. Please contact pharmacy if questions.  Thanks!  Tad Moore, BCPS  Clinical Pharmacist Pager [phone]  12/27/2012 2:53 PM

## 2012-12-27 NOTE — H&P (Signed)
PCP:  Katy Apo, MD  Cardiology: [facility]: [facility] but have not seen them yet  Chief Complaint:  Renal failure  HPI: Gloria Mcintosh is a 79 y.o. female   has a past medical history of Tremors of nervous system; Hypertension; Diabetes mellitus; High cholesterol; Coronary artery disease; and Gout (2014).   Presented with  1 week hx of back pain  She was seen at [facility] and was diagnosed with kidney stone and prescribed percocet.  She developed confusion. Her family tried to set up an appointment with urology. Urology office did notify them that she had a UTI based on labs obtained during her ER visit. Keflex was called in to the pharmacy. The patient was scheduled the first appointment next week but she continued to be severely confused. The family brought her to her PCP and labs were done showing new onset of ARF. Family was tolled to bring her straight to ER. In ER CT of the abdomen was done worrisome for Left hydronephrosis slightly increased from prior and distally migrating stone. Cr was found to be elevated to 4.69 with sodium of 119. Family reports low grade fever. Urology has been consulted and will see patient.   Review of Systems:    Pertinent positives include: Fevers, chills, back pain, nausea,   Constitutional:  No weight loss, night sweats,  fatigue, weight loss  HEENT:  No headaches, Difficulty swallowing,Tooth/dental problems,Sore throat,  No sneezing, itching, ear ache, nasal congestion, post nasal drip,  Cardio-vascular:  No chest pain, Orthopnea, PND, anasarca, dizziness, palpitations.no Bilateral lower extremity swelling  GI:  No heartburn, indigestion, abdominal pain, vomiting, diarrhea, change in bowel habits, loss of appetite, melena, blood in stool, hematemesis Resp:  no shortness of breath at rest. No dyspnea on exertion, No excess mucus, no productive cough, No non-productive cough, No coughing up of blood.No change in color of  mucus.No wheezing. Skin:  no rash or lesions. No jaundice GU:  no dysuria, change in color of urine, no urgency or frequency. No straining to urinate.  No flank pain.  Musculoskeletal:  No joint pain or no joint swelling. No decreased range of motion. No back pain.  Psych:  No change in mood or affect. No depression or anxiety. No memory loss.  Neuro: no localizing neurological complaints, no tingling, no weakness, no double vision, no gait abnormality, no slurred speech, no confusion  Otherwise ROS are negative except for above, 10 systems were reviewed  Past Medical History: Past Medical History  Diagnosis Date  . Tremors of nervous system   . Hypertension   . Diabetes mellitus   . High cholesterol   . Coronary artery disease   . Gout 2014   Past Surgical History  Procedure Laterality Date  . Abdominal hysterectomy    . Hand surgery      trigger finger and carpal tunnel  . Cardiac catheterization  1999    stent to LAD     Medications: Prior to Admission medications   Medication Sig Start Date End Date Taking? Authorizing Provider  colesevelam (WELCHOL) 625 MG tablet Take 1,875 mg by mouth 3 (three) times daily.    Yes Historical Provider, MD  gabapentin (NEURONTIN) 300 MG capsule Take 300 mg by mouth 3 (three) times daily.   Yes Historical Provider, MD  glipiZIDE (GLUCOTROL) 10 MG tablet Take 0.5 tablets (5 mg total) by mouth 2 (two) times daily before a meal. 12/21/12  Yes Gwyneth Sprout, MD  hydrochlorothiazide (MICROZIDE) 12.5 MG  capsule Take 12.5 mg by mouth daily.   Yes Historical Provider, MD  levothyroxine (SYNTHROID, LEVOTHROID) 88 MCG tablet Take 88 mcg by mouth daily before breakfast.   Yes Historical Provider, MD  lisinopril (PRINIVIL,ZESTRIL) 40 MG tablet Take 40 mg by mouth daily.   Yes Historical Provider, MD  meclizine (ANTIVERT) 25 MG tablet Take 1 tablet (25 mg total) by mouth 3 (three) times daily as needed. 07/25/12  Yes Sudie Grumbling, MD  metFORMIN  (GLUCOPHAGE) 500 MG tablet Take 1,000 mg by mouth 2 (two) times daily with a meal.   Yes Historical Provider, MD  metoprolol (LOPRESSOR) 50 MG tablet Take 25 mg by mouth daily.   Yes Historical Provider, MD  oxyCODONE-acetaminophen (PERCOCET/ROXICET) 5-325 MG per tablet Take 1 tablet by mouth every 4 (four) hours as needed for pain.   Yes Historical Provider, MD  tamsulosin (FLOMAX) 0.4 MG CAPS Take 1 capsule (0.4 mg total) by mouth daily after supper. 12/21/12  Yes Gwyneth Sprout, MD  traMADol (ULTRAM) 50 MG tablet Take 50 mg by mouth every 6 (six) hours as needed for pain.   Yes Historical Provider, MD    Allergies:   Allergies  Allergen Reactions  . Inderal (Propranolol Hcl) Rash    Social History:  Ambulatory   independently   Lives at  Home with husband   reports that she has never smoked. She does not have any smokeless tobacco history on file. She reports that she does not drink alcohol or use illicit drugs.   Family History: family history includes Diabetes type II in her brother, father, and son; Hypertension in her daughter, mother, and son; Lymphoma in her mother; Rheum arthritis in her mother; and Stroke in her sister.    Physical Exam: Patient Vitals for the past 24 hrs:  BP Temp Temp src Pulse Resp SpO2  12/26/12 2300 116/49 mmHg - - 86 21 96 %  12/26/12 2200 113/63 mmHg - - 84 22 95 %  12/26/12 2034 113/47 mmHg - - - 19 99 %  12/26/12 1838 129/57 mmHg 97.4 F (36.3 C) Oral 84 26 96 %  12/26/12 1824 130/70 mmHg 98.4 F (36.9 C) Oral 83 24 96 %    1. General:  in No Acute distress 2. Psychological: somnolent not Oriented 3. Head/ENT:    Dry Mucous Membranes                          Head Non traumatic, neck supple                          Normal   Dentition 4. SKIN:  decreased Skin turgor,  Skin clean Dry and intact no rash 5. Heart: Regular rate and rhythm no Murmur, Rub or gallop 6. Lungs: Clear to auscultation bilaterally, no wheezes or crackles   7.  Abdomen: Soft, non-tender, Non distended 8. Lower extremities: no clubbing, cyanosis, or edema 9. Neurologically Grossly intact, moving all 4 extremities equally 10. MSK: Normal range of motion, Left lower back tender to palpation  body mass index is unknown because there is no weight on file.   Labs on Admission:   Recent Labs  12/26/12 1839  NA 119*  K 4.7  CL 85*  CO2 17*  GLUCOSE 209*  BUN 56*  CREATININE 4.69*  CALCIUM 9.0    Recent Labs  12/26/12 1839  AST 26  ALT 16  ALKPHOS 93  BILITOT 0.3  PROT 7.2  ALBUMIN 2.5*   No results found for this basename: LIPASE, AMYLASE,  in the last 72 hours  Recent Labs  12/26/12 1839  WBC 12.9*  NEUTROABS 11.8*  HGB 10.3*  HCT 28.7*  MCV 87.8  PLT 153   No results found for this basename: CKTOTAL, CKMB, CKMBINDEX, TROPONINI,  in the last 72 hours No results found for this basename: TSH, T4TOTAL, FREET3, T3FREE, THYROIDAB,  in the last 72 hours No results found for this basename: VITAMINB12, FOLATE, FERRITIN, TIBC, IRON, RETICCTPCT,  in the last 72 hours No results found for this basename: HGBA1C    The CrCl is unknown because both a height and weight (above a minimum accepted value) are required for this calculation. ABG No results found for this basename: phart, pco2, po2, hco3, tco2, acidbasedef, o2sat     No results found for this basename: DDIMER       UA evidence of UTI BNP 2178  Cultures:    Component Value Date/Time   SDES URINE, CATHETERIZED 12/21/2012 0228   SPECREQUEST NONE 12/21/2012 0228   CULT KLEBSIELLA PNEUMONIAE 12/21/2012 0228   REPTSTATUS 12/23/2012 FINAL 12/21/2012 0228       Radiological Exams on Admission: Dg Chest 2 View  12/26/2012  *RADIOLOGY REPORT*  Clinical Data: Weakness, abnormal labs  CHEST - 2 VIEW  Comparison: None.  Findings: Lungs are clear. No pleural effusion or pneumothorax.  Mild cardiomegaly.  Mild degenerative changes of the visualized thoracolumbar spine.  Vascular  calcifications.  IMPRESSION: No evidence of acute cardiopulmonary disease.   Original Report Authenticated By: Charline Bills, M.D.    Ct Head Wo Contrast  12/26/2012  *RADIOLOGY REPORT*  Clinical Data: 79 year old female with weakness.  Recent urinary tract infection.  CT HEAD WITHOUT CONTRAST  Technique:  Contiguous axial images were obtained from the base of the skull through the vertex without contrast.  Comparison: 07/25/2012 and earlier.  Findings: Visualized paranasal sinuses and mastoids are clear.  No acute osseous abnormality identified.  Visualized orbits and scalp soft tissues are within normal limits.  Calcified atherosclerosis at the skull base.  Cerebral volume is stable and normal for age.  No ventriculomegaly. No midline shift, mass effect, or evidence of mass lesion.  Gray-white matter differentiation is within normal limits throughout the brain.  No evidence of cortically based acute infarction identified.  No suspicious intracranial vascular hyperdensity. No acute intracranial hemorrhage identified.  IMPRESSION: Stable and normal for age noncontrast CT appearance of the brain.   Original Report Authenticated By: Erskine Speed, M.D.     Chart has been reviewed  Assessment/Plan  79 yo W obstructing kidney stone and UTI resulting in ARF  Present on Admission:  . Acute renal failure - Urology is aware will see patient in consult, will admit to step down, IVF, NPO incase she will need stenting . Hyponatremia - likely combination of hypovolemia and renal dysfunction, admit to stepdown and monitor Na. Give IVF, hold HCTZ. Urine electrolytes . Hypertension - hold PO meds, but continue metoprolol at lower dose with holding parameters.  . Diabetes mellitus - ssi,hold PO Meds . Hydronephrosis - urology is aware and will consult . Kidney stone - as per urology, pain management  . UTI (urinary tract infection) - on broad spectrum abx, (zosyn), urine cult pending, obtained blood cultures  and lactic acid   Prophylaxis: SCD  Protonix  CODE STATUS: DNR/DNI per patient's family wishes  Other plan as per orders.  I  have spent a total of 65 min on this admission, time is taken to discuss her case with CCM  Jlee Harkless 12/27/2012, 12:05 AM

## 2012-12-27 NOTE — Progress Notes (Signed)
Subjective: Gloria Mcintosh was admitted with obstructive nephrolithiasis with renal insufficiency and obstructive pylonephritis. She has had operative intervention with ureteral stent placement. She ismore comfortable today than yesterday  Objective: Lab:  Recent Labs  12/26/12 1839 12/27/12 0831  WBC 12.9* 12.8*  NEUTROABS 11.8*  --   HGB 10.3* 10.4*  HCT 28.7* 28.8*  MCV 87.8 88.9  PLT 153 149*    Recent Labs  12/26/12 1839 12/27/12 0831  NA 119* 124*  K 4.7 4.5  CL 85* 91*  GLUCOSE 209* 128*  BUN 56* 64*  CREATININE 4.69* 5.19*  CALCIUM 9.0 8.4  MG  --  1.9  PHOS  --  4.3    Imaging:  Scheduled Meds: . docusate sodium  100 mg Oral BID  . insulin aspart  0-9 Units Subcutaneous Q4H  . metoprolol  12.5 mg Oral BID  . pantoprazole (PROTONIX) IV  40 mg Intravenous QHS  . piperacillin-tazobactam (ZOSYN)  IV  3.375 g Intravenous Q8H  . sodium chloride  3 mL Intravenous Q12H  . tamsulosin  0.4 mg Oral QPC supper   Continuous Infusions: . sodium chloride 1,000 mL (12/27/12 0536)   PRN Meds:.acetaminophen, acetaminophen, HYDROcodone-acetaminophen, morphine injection, ondansetron (ZOFRAN) IV, ondansetron   Physical Exam: Filed Vitals:   12/27/12 0800  BP: 92/46  Pulse: 82  Temp: 97.7 F (36.5 C)  Resp: 28   gen'l - Elderly but well preserved white woman in no acute distress HEENT- dry mouth, C&S clear Cor - quiet precordium, RRR, 2+ radial pulse Pulm - normal respirations Abd - BS+, no guarding, tender left side Neuro - A&O x 3.      Assessment/Plan: 1. GU - obstructing nephrolithiasis s/p stent placement Plan - per GU  2. ID - Post-obstructive infection. Day #2 Zosyn. WBC remains elevated. Urine Cx pending Plan Continue Zosyn pending culture and sensitivities  3. Acute Renal insufficiency - secondary to obstruction, relieved with Stent Plan  Hydration  F/u Bmet  4. DM -  CBG (last 3)   Recent Labs  12/27/12 0241 12/27/12 0602  12/27/12 0848  GLUCAP 149* 152* 117*    Plan Continue SS  Will start full liquid diet. PT/OT eval   [facility] (o) [phone]; (c) [phone] Call-grp - [facility]  Tele: [phone]  12/27/2012, 11:06 AM

## 2012-12-28 ENCOUNTER — Inpatient Hospital Stay (HOSPITAL_COMMUNITY): Payer: Medicare Other

## 2012-12-28 LAB — CBC WITH DIFFERENTIAL/PLATELET
HCT: 27.6 % — ABNORMAL LOW (ref 36.0–46.0)
Hemoglobin: 9.6 g/dL — ABNORMAL LOW (ref 12.0–15.0)
Lymphocytes Relative: 4 % — ABNORMAL LOW (ref 12–46)
Lymphs Abs: 0.5 10*3/uL — ABNORMAL LOW (ref 0.7–4.0)
Monocytes Absolute: 0.6 10*3/uL (ref 0.1–1.0)
Monocytes Relative: 5 % (ref 3–12)
Neutro Abs: 11.2 10*3/uL — ABNORMAL HIGH (ref 1.7–7.7)
Neutrophils Relative %: 91 % — ABNORMAL HIGH (ref 43–77)
RBC: 3.09 MIL/uL — ABNORMAL LOW (ref 3.87–5.11)

## 2012-12-28 LAB — URINE CULTURE

## 2012-12-28 LAB — BASIC METABOLIC PANEL
BUN: 71 mg/dL — ABNORMAL HIGH (ref 6–23)
CO2: 18 mEq/L — ABNORMAL LOW (ref 19–32)
Calcium: 8.4 mg/dL (ref 8.4–10.5)
GFR calc non Af Amer: 6 mL/min — ABNORMAL LOW (ref >90)
Glucose, Bld: 100 mg/dL — ABNORMAL HIGH (ref 70–99)
Sodium: 131 mEq/L — ABNORMAL LOW (ref 135–145)

## 2012-12-28 LAB — GLUCOSE, CAPILLARY
Glucose-Capillary: 97 mg/dL (ref 70–99)
Glucose-Capillary: 156 mg/dL — ABNORMAL HIGH (ref 70–99)
Glucose-Capillary: 178 mg/dL — ABNORMAL HIGH (ref 70–99)

## 2012-12-28 LAB — RENAL FUNCTION PANEL
Albumin: 1.8 g/dL — ABNORMAL LOW (ref 3.5–5.2)
BUN: 71 mg/dL — ABNORMAL HIGH (ref 6–23)
CO2: 15 mEq/L — ABNORMAL LOW (ref 19–32)
Chloride: 96 mEq/L (ref 96–112)
Creatinine, Ser: 5.37 mg/dL — ABNORMAL HIGH (ref 0.50–1.10)
Glucose, Bld: 181 mg/dL — ABNORMAL HIGH (ref 70–99)
Potassium: 4.7 mEq/L (ref 3.5–5.1)

## 2012-12-28 LAB — CBC
Hemoglobin: 9.5 g/dL — ABNORMAL LOW (ref 12.0–15.0)
MCH: 31.5 pg (ref 26.0–34.0)
MCHC: 35.1 g/dL (ref 30.0–36.0)
MCV: 89.7 fL (ref 78.0–100.0)
Platelets: 156 10*3/uL (ref 150–400)
RBC: 3.02 MIL/uL — ABNORMAL LOW (ref 3.87–5.11)

## 2012-12-28 LAB — HEPATITIS B CORE ANTIBODY, TOTAL: Hep B Core Total Ab: NEGATIVE

## 2012-12-28 MED ORDER — LIDOCAINE-PRILOCAINE 2.5-2.5 % EX CREA: 1.0000 "application " | TOPICAL_CREAM | CUTANEOUS | Status: DC | PRN

## 2012-12-28 MED ORDER — SODIUM CHLORIDE 0.9 % IV SOLN: 100.0000 mL | INTRAVENOUS | Status: DC | PRN

## 2012-12-28 MED ORDER — PANTOPRAZOLE SODIUM 40 MG PO TBEC
40.0000 mg | DELAYED_RELEASE_TABLET | Freq: Every day | ORAL | Status: DC
  Administered 2012-12-29 – 2013-01-02 (×5): 40 mg via ORAL
  Filled 2012-12-28 (×5): qty 1

## 2012-12-28 MED ORDER — HEPARIN SODIUM (PORCINE) 1000 UNIT/ML DIALYSIS
2000.0000 [IU] | INTRAMUSCULAR | Status: DC | PRN
  Administered 2012-12-28: 2000 [IU] via INTRAVENOUS_CENTRAL
  Filled 2012-12-28: qty 2

## 2012-12-28 MED ORDER — PENTAFLUOROPROP-TETRAFLUOROETH EX AERO: 1.0000 "application " | INHALATION_SPRAY | CUTANEOUS | Status: DC | PRN

## 2012-12-28 MED ORDER — FUROSEMIDE 10 MG/ML IJ SOLN
20.0000 mg | Freq: Two times a day (BID) | INTRAMUSCULAR | Status: DC
  Administered 2012-12-28: 20 mg via INTRAVENOUS
  Filled 2012-12-28: qty 2

## 2012-12-28 MED ORDER — ALTEPLASE 2 MG IJ SOLR: 2.0000 mg | Freq: Once | INTRAMUSCULAR | Status: AC | PRN

## 2012-12-28 MED ORDER — LIDOCAINE HCL (PF) 1 % IJ SOLN: 5.0000 mL | INTRAMUSCULAR | Status: DC | PRN

## 2012-12-28 MED ORDER — HEPARIN SODIUM (PORCINE) 1000 UNIT/ML DIALYSIS: 1000.0000 [IU] | INTRAMUSCULAR | Status: DC | PRN

## 2012-12-28 MED ORDER — NEPRO/CARBSTEADY PO LIQD: 237.0000 mL | ORAL | Status: DC | PRN

## 2012-12-28 NOTE — Progress Notes (Signed)
Subjective: Gloria Mcintosh is awake. She is still having LLQ pain. No other complaints  Objective: Lab:  Recent Labs  12/26/12 1839 12/27/12 0831 12/28/12 0709  WBC 12.9* 12.8* 12.4*  NEUTROABS 11.8*  --   --   HGB 10.3* 10.4* 9.5*  HCT 28.7* 28.8* 27.1*  MCV 87.8 88.9 89.7  PLT 153 149* 156    Recent Labs  12/26/12 1839 12/27/12 0831 12/28/12 0709  NA 119* 124* 131*  K 4.7 4.5 4.7  CL 85* 91* 97  GLUCOSE 209* 128* 100*  BUN 56* 64* 71*  CREATININE 4.69* 5.19* 5.51*  CALCIUM 9.0 8.4 8.4  MG  --  1.9  --   PHOS  --  4.3  --     Imaging:  Scheduled Meds: . docusate sodium  100 mg Oral BID  . insulin aspart  0-9 Units Subcutaneous Q4H  . metoprolol  12.5 mg Oral BID  . pantoprazole (PROTONIX) IV  40 mg Intravenous QHS  . piperacillin-tazobactam (ZOSYN)  IV  2.25 g Intravenous Q6H  . sodium chloride  3 mL Intravenous Q12H  . tamsulosin  0.4 mg Oral QPC supper   Continuous Infusions: . sodium chloride 1,000 mL (12/27/12 2032)   PRN Meds:.acetaminophen, acetaminophen, HYDROcodone-acetaminophen, morphine injection, ondansetron (ZOFRAN) IV, ondansetron   Physical Exam: Filed Vitals:   12/28/12 0900  BP: 132/62  Pulse: 82  Temp: 97.9  Resp: 18    Intake/Output Summary (Last 24 hours) at 12/28/12 0942 Last data filed at 12/28/12 0900  Gross per 24 hour  Intake   3113 ml  Output   1275 ml  Net   1838 ml   Total this adm: +2,938 Gen'l - elderly white woman in no distress HEENT - C&S clear Cor - 2+ radial, quiet precordium, RRR Pulm - normal respirations, no rales Abd - BS+ x 4, soft, tender LLQ Neuro - A&O x 3      Assessment/Plan: 1. GU - obstructing nephrolithiasis s/p stent placement. Good urine flow but Creatinine has gone up. Plan Per GU: for follow up renal u/s  2. ID - Afebrile, WBC remains elevated. Day #3 zosyn Plan Continue present antibiotic regimen  3. Acute renal insufficiency - creatinine has gone up. Expected drop after removal  of obstruction. Question of prerenal disease, e.g. Hypotension with initial illness - prerenal azotemia vs diabetic nephropathy Plan Renal u/s as above.  Renal consult - discussed situation with the patient: she is willing to have HD is needed.  4. DM -  CBG (last 3)   Recent Labs  12/28/12 0016 12/28/12 0434 12/28/12 0750  GLUCAP 114* 94 97    Stable on sliding scale.  5. F/E/N - diet started. Patient approx 3 liters ahead.  Plan - low dose IV furosemide, continue low rate IVF.   Illene Regulus [facility] (o) [phone]; (c) [phone] Call-grp - [facility]  Tele: [phone]  12/28/2012, 9:41 AM

## 2012-12-28 NOTE — Evaluation (Signed)
Physical Therapy Evaluation Patient Details Name: Gloria Mcintosh MRN: [id number] DOB: May 08, 1925 Today's Date: 12/28/2012 Time: 3086-5784 PT Time Calculation (min): 35 min  PT Assessment / Plan / Recommendation Clinical Impression  79 yo presents with significant limits to functional mobility exacerbated by cognitive issues of confusion, poor memory and limited attention to task.  Lives with spouse, unable to clearly describe presence or level of assistance available upon d/c however does endorse need to be stronger and safer prior to return home.  Will need to monitor progress acutely but safest recommendation is short term SNF at d/c unless shows enough improvement to justify home with [facility].    PT Assessment  Patient needs continued PT services    Follow Up Recommendations  SNF;Home health PT;Supervision/Assistance - 24 hour    Does the patient have the potential to tolerate intense rehabilitation      Barriers to Discharge Decreased caregiver support      Equipment Recommendations  None recommended by PT    Recommendations for Other Services     Frequency Min 3X/week    Precautions / Restrictions Precautions Precautions: Fall   Pertinent Vitals/Pain Mild pain abdomen, improved with/after walking      Mobility  Bed Mobility Bed Mobility: Supine to Sit Supine to Sit: 3: Mod assist;HOB elevated;With rails Details for Bed Mobility Assistance: requires cues for sequencing task, physical assist to raise trunk into sitting and to stabilize initially upon sitting Transfers Transfers: Sit to Stand;Stand to Sit Sit to Stand: 4: Min assist;With upper extremity assist;From bed Stand to Sit: 4: Min guard;With upper extremity assist;To chair/3-in-1 Details for Transfer Assistance: cues to await assist for safety, physical cues for steady to transition to RW, assist to guide and control descent into recliner including to use armrests Ambulation/Gait Ambulation/Gait Assistance:  3: Mod assist Ambulation Distance (Feet): 50 Feet Assistive device: Rolling walker Ambulation/Gait Assistance Details: physical assist to prevent LOB on multiple occassions primarily to the right and esepcially with turns to the left... cues for hand placement at [facility]... cues for posture and to keep eyes forward. Gait Pattern: Decreased stride length;Narrow base of support Gait velocity: unmeasured, suboptimal by observation General Gait Details: keeps hands forward of RW handles, stooped posture with eyes down, slow speed, difficulty maneuvering RW to turn 180 degrees, tends to lean/veer toward right Stairs: No Wheelchair Mobility Wheelchair Mobility: No    Exercises     PT Diagnosis: Difficulty walking  PT Problem List: Decreased strength;Decreased range of motion;Decreased activity tolerance;Decreased balance;Decreased mobility;Decreased cognition;Decreased safety awareness;Decreased knowledge of precautions PT Treatment Interventions: DME instruction;Gait training;Functional mobility training;Therapeutic exercise;Therapeutic activities;Balance training;Patient/family education   PT Goals Acute Rehab PT Goals PT Goal Formulation: With patient Time For Goal Achievement: 01/11/13 Potential to Achieve Goals: Good Pt will go Supine/Side to Sit: with modified independence PT Goal: Supine/Side to Sit - Progress: Goal set today Pt will go Sit to Supine/Side: with modified independence PT Goal: Sit to Supine/Side - Progress: Goal set today Pt will go Sit to Stand: with modified independence PT Goal: Sit to Stand - Progress: Goal set today Pt will go Stand to Sit: with modified independence PT Goal: Stand to Sit - Progress: Goal set today Pt will Transfer Bed to Chair/Chair to Bed: with modified independence PT Transfer Goal: Bed to Chair/Chair to Bed - Progress: Goal set today Pt will Ambulate: 51 - 150 feet;with supervision;with rolling walker PT Goal: Ambulate - Progress: Goal set  today  Visit Information  Last PT Received On: 12/28/12  Assistance Needed: +1    Subjective Data  Subjective: I feel better Patient Stated Goal: take care of myself   Prior Functioning  Home Living Lives With: Spouse Available Help at Discharge:  (pt unable to clearly state) Type of Home: House Home Access:  (pt unable to give accurate report) Home Adaptive Equipment: Walker - rolling Additional Comments: pt confused and unable to give consistent/reliable answers to most inquiries Prior Function Level of Independence: Independent with assistive device(s) Comments: per pt she drives and grocery shops and also needs help but cannot say who helps her with what activities Communication Communication: No difficulties    Cognition  Cognition Overall Cognitive Status: Impaired Area of Impairment: Attention;Awareness of deficits;Memory Arousal/Alertness: Awake/alert Orientation Level: Appears intact for tasks assessed Behavior During Session: [facility] for tasks performed Current Attention Level: Selective Memory: Decreased recall of precautions Memory Deficits: cannot accurately or consistently report functional history Cognition - Other Comments: discussed with RN who believes renal function may contribute to cognitive issues    Extremity/Trunk Assessment Right Upper Extremity Assessment RUE ROM/Strength/Tone: Deficits;Due to pain RUE ROM/Strength/Tone Deficits: limited ROM at shoulders due to arthritis per pt Left Upper Extremity Assessment LUE ROM/Strength/Tone: Deficits;Due to pain LUE ROM/Strength/Tone Deficits: limited ROM at shoulder due to arthritis Right Lower Extremity Assessment RLE ROM/Strength/Tone: [facility] for tasks assessed Left Lower Extremity Assessment LLE ROM/Strength/Tone: [facility] for tasks assessed   Balance    End of Session PT - End of Session Equipment Utilized During Treatment: Gait belt Activity Tolerance: Patient tolerated treatment well Patient left: in  chair;with call bell/phone within reach Nurse Communication: Mobility status;Precautions  GP     Dennis Bast 12/28/2012, 2:04 PM

## 2012-12-28 NOTE — Procedures (Signed)
Under sterile conditions and after informed consent from daughter, a right femoral 20cm 3-.lumen Trialysis hemodialysis catheter was placed using US guidance without difficulty.  Pt was asleep during procedure and tolerated it well.  Access is ready to use.   Vinson Moselle  MD [phone] pgr    [phone] cell 12/28/2012, 4:35 PM

## 2012-12-28 NOTE — Consult Note (Signed)
Gloria Mcintosh 12/28/2012 Darl Kuss D Requesting Physician:  Dr Debby Bud   Reason for Consult:  Acute on chronic renal failure HPI: The patient is a 79 y.o. year-old with hx of HTN, DM2 in ED on 4/6 with L flank pain, blood on UA and CT scan (+contrasted) showed L hydro.  Pt d/c'd home on po abx. Readmitteed on 4/11 with AMS and creat of 4.3  Urology did stent procedure to L ureter on 12/26/12.  UOP was 1275 yesterday. She is getting IV lasix 20mg  every 12 hours.  IVF's are 1/2 NS of half normal saline. Current meds are IV zosyn, flomax, protonix, metoprolol, insulin, vicodin, Lasix IV, colace, and tylenol prn.       Home meds were Welchol, neurontin, Synthroid, Glucotrol, lisinopril, metoprolol, percocet, flomax and Ultram  Patient is up in chair. Very confused and lethargic.   ROS  no available   Past Medical History:  Past Medical History  Diagnosis Date  . Tremors of nervous system   . Hypertension   . Diabetes mellitus   . High cholesterol   . Coronary artery disease   . Gout 2014  . Arthritis     right shoulder    Past Surgical History:  Past Surgical History  Procedure Laterality Date  . Abdominal hysterectomy    . Hand surgery      trigger finger and carpal tunnel  . Cardiac catheterization  1999    stent to LAD    Family History:  Family History  Problem Relation Age of Onset  . Rheum arthritis Mother   . Lymphoma Mother   . Hypertension Mother   . Diabetes type II Father   . Stroke Sister   . Diabetes type II Brother   . Hypertension Daughter   . Hypertension Son   . Diabetes type II Son    Social History:  reports that she has never smoked. She does not have any smokeless tobacco history on file. She reports that she does not drink alcohol or use illicit drugs.  Allergies:  Allergies  Allergen Reactions  . Inderal (Propranolol Hcl) Rash    Home medications: Prior to Admission medications   Medication Sig Start Date End Date Taking?  Authorizing Provider  ciprofloxacin (CIPRO) 250 MG tablet Take 250 mg by mouth 2 (two) times daily. Started 4.11.14 for 5 days, ending 4.16.14   Yes Historical Provider, MD  colesevelam (WELCHOL) 625 MG tablet Take 1,875 mg by mouth 3 (three) times daily.    Yes Historical Provider, MD  gabapentin (NEURONTIN) 300 MG capsule Take 300 mg by mouth 3 (three) times daily.   Yes Historical Provider, MD  glipiZIDE (GLUCOTROL) 10 MG tablet Take 0.5 tablets (5 mg total) by mouth 2 (two) times daily before a meal. 12/21/12  Yes Gwyneth Sprout, MD  levothyroxine (SYNTHROID, LEVOTHROID) 88 MCG tablet Take 88 mcg by mouth daily before breakfast.   Yes Historical Provider, MD  lisinopril (PRINIVIL,ZESTRIL) 40 MG tablet Take 40 mg by mouth daily.   Yes Historical Provider, MD  metFORMIN (GLUCOPHAGE) 500 MG tablet Take 1,000 mg by mouth 2 (two) times daily with a meal.   Yes Historical Provider, MD  metoprolol (LOPRESSOR) 50 MG tablet Take 25 mg by mouth daily.   Yes Historical Provider, MD  oxyCODONE-acetaminophen (PERCOCET/ROXICET) 5-325 MG per tablet Take 1 tablet by mouth every 4 (four) hours as needed for pain.   Yes Historical Provider, MD  tamsulosin (FLOMAX) 0.4 MG CAPS Take 1 capsule (0.4 mg  total) by mouth daily after supper. 12/21/12  Yes Gwyneth Sprout, MD  traMADol (ULTRAM) 50 MG tablet Take 50 mg by mouth every 6 (six) hours as needed for pain.   Yes Historical Provider, MD    Labs: Basic Metabolic Panel:  Recent Labs Lab 12/26/12 1839 12/27/12 0831 12/28/12 0709 12/28/12 1031  NA 119* 124* 131* 128*  K 4.7 4.5 4.7 4.7  CL 85* 91* 97 96  CO2 17* 17* 18* 15*  GLUCOSE 209* 128* 100* 181*  BUN 56* 64* 71* 71*  CREATININE 4.69* 5.19* 5.51* 5.37*  CALCIUM 9.0 8.4 8.4 8.3*  PHOS  --  4.3  --  5.4*   Liver Function Tests:  Recent Labs Lab 12/26/12 1839 12/27/12 0831 12/28/12 1031  AST 26 26  --   ALT 16 16  --   ALKPHOS 93 100  --   BILITOT 0.3 0.2*  --   PROT 7.2 6.9  --    ALBUMIN 2.5* 2.3* 1.8*   No results found for this basename: LIPASE, AMYLASE,  in the last 168 hours No results found for this basename: AMMONIA,  in the last 168 hours CBC:  Recent Labs Lab 12/26/12 1839 12/27/12 0831 12/28/12 0709 12/28/12 1031  WBC 12.9* 12.8* 12.4* 12.3*  NEUTROABS 11.8*  --   --  11.2*  HGB 10.3* 10.4* 9.5* 9.6*  HCT 28.7* 28.8* 27.1* 27.6*  MCV 87.8 88.9 89.7 89.3  PLT 153 149* 156 148*   PT/INR: @LABRCNTIP (inr:5) Cardiac Enzymes: )No results found for this basename: CKTOTAL, CKMB, CKMBINDEX, TROPONINI,  in the last 168 hours CBG:  Recent Labs Lab 12/27/12 2038 12/28/12 0016 12/28/12 0434 12/28/12 0750 12/28/12 1125  GLUCAP 158* 114* 94 97 156*     Physical Exam:  Blood pressure 130/61, pulse 79, temperature 98.1 F (36.7 C), temperature source Oral, resp. rate 18, height 5\' 2"  (1.575 m), weight 67 kg (147 lb 11.3 oz), SpO2 99.00%.  Gen: up in chair, elderly WF lethargic Skin: no rash, cyanosis HEENT:  EOMI, sclera anicteric, throat clear Neck: + JVD, no LAN Chest: clear bilat CV: regular, no rub or gallop, no murmur, no carotid or femoral bruits, pedal pulses intact on L, decreased on R Abdomen: soft, protuberant, +BS, no ascites no HSM Ext: +livedo reticularis pattern of legs and thighs, no LE or UE edema , no joint effusion or deformity, no gangrene or ulceration Neuro: confused, somnolent, arouseable, "1940", "I'm at home"    Date    Creat  eGFR Jul 25, 2012  1.20  40 December 21, 2012  1.30  36 December 26, 2012  4.69  8 December 27, 2012  5.19  7 December 28, 2012  5.37  7  UA-- 1.015, 5.5, 100 protein, +nit, mod LE, 21-50wbc, 11-20rbc, many bacteria Urine Na- 46 Renal US-- R 11.8 with mild cortical thinning.  L 13.0, mild fullness of mid/lower pole calyces.  No obvious stent CXR 4/11- within normal limits  Impression/Plan 1. Acute on CKD III due to IV contrast- pt is confused, uremic, recommend HD tonight.  She is a bit frail and is  elderly.  Not sure how she would do with long-term HD but in the short-term we will place temp catheter and start HD ASAP. I have d/w pt's daughter. Pt is too confused to consent 2. CKD IIIb- eGFR 35-40 at baseline  3. HTN/volume- was on lisinopril and metoprolol at home, just metoprolol here. BP 130/80 4. AMS- due to uremia. Hold narcotics until MS  to baseline 5. Klebs UTI from 12/21/12- urine cx here multiple morphotypes, blood cx's negative   Vinson Moselle  MD [facility] [phone] pgr     [phone] cell 12/28/2012, 3:17 PM

## 2012-12-28 NOTE — Progress Notes (Signed)
1 Day Post-Op Subjective: Patient reports that she is overall comfortable. She still does not have a great appetite. She is having no nausea.  Objective: Vital signs in last 24 hours: Temp:  [97.7 F (36.5 C)-98.5 F (36.9 C)] 97.9 F (36.6 C) (04/13 0748) Pulse Rate:  [71-94] 82 (04/13 0900) Resp:  [16-28] 18 (04/13 0900) BP: (108-143)/(46-91) 132/62 mmHg (04/13 0800) SpO2:  [98 %-100 %] 99 % (04/13 0900) Weight:  [67 kg (147 lb 11.3 oz)] 67 kg (147 lb 11.3 oz) (04/13 0454)  Intake/Output from previous day: 04/12 0701 - 04/13 0700 In: 2683 [P.O.:1380; I.V.:1153; IV Piggyback:150] Out: 1275 [Urine:1275] Intake/Output this shift:    Physical Exam:  Constitutional: Vital signs reviewed. WD WN in NAD   Eyes: PERRL, No scleral icterus.   Pulmonary/Chest: Normal effort. Oxygen saturation excellent Abdominal: Soft. Minimal left lower quadrant tenderness , minimal distention, bowel sounds are normal, no masses, organomegaly, or guarding present.  Genitourinary: Extremities: No cyanosis or edema   Lab Results:  Recent Labs  12/26/12 1839 12/27/12 0831 12/28/12 0709  HGB 10.3* 10.4* 9.5*  HCT 28.7* 28.8* 27.1*   BMET  Recent Labs  12/27/12 0831 12/28/12 0709  NA 124* 131*  K 4.5 4.7  CL 91* 97  CO2 17* 18*  GLUCOSE 128* 100*  BUN 64* 71*  CREATININE 5.19* 5.51*  CALCIUM 8.4 8.4   No results found for this basename: LABPT, INR,  in the last 72 hours No results found for this basename: LABURIN,  in the last 72 hours Results for orders placed during the hospital encounter of 12/26/12  MRSA PCR SCREENING     Status: None   Collection Time    12/27/12  4:26 AM      Result Value Range Status   MRSA by PCR NEGATIVE  NEGATIVE Final   Comment:            The GeneXpert MRSA Assay (FDA     approved for NASAL specimens     only), is one component of a     comprehensive MRSA colonization     surveillance program. It is not     intended to diagnose MRSA     infection  nor to guide or     monitor treatment for     MRSA infections.    Studies/Results: Ct Abdomen Pelvis Wo Contrast  12/27/2012  *RADIOLOGY REPORT*  Clinical Data: Urinary tract infection.  Urolithiasis.Renal insufficiency.  CT ABDOMEN AND PELVIS WITHOUT CONTRAST  Technique:  Multidetector CT imaging of the abdomen and pelvis was performed following the standard protocol without intravenous contrast.  Comparison: 12/21/2012  Findings: Mild increase in left-sided hydronephrosis and perinephric stranding are seen.  Increased dilatation of left ureter is seen and the previously noted calculus near the left ureteral pelvic junction has migrated distally to the ureterovesicle junction.  This measures approximately 4 mm.  An air- fluid level is seen in the urinary bladder likely from recent bladder catheterization.  No evidence of right-sided ureteral calculus or hydronephrosis.  Prior hysterectomy noted.  The other abdominal parenchymal organs show no significant abnormality.  Small low attenuation lesions in the right hepatic lobe are stable likely representing tiny benign cysts or hemangiomas.  Gallbladder is unremarkable.  No soft tissue masses or lymphadenopathy identified.  No evidence of dilated bowel loops.  IMPRESSION:  4 mm left ureteral calculus shows distal migration, now 1 year the left ureterovesicle junction.  Mild increase in moderate left hydronephrosis and perinephric stranding.  Original Report Authenticated By: Myles Rosenthal, M.D.    Dg Chest 2 View  12/26/2012  *RADIOLOGY REPORT*  Clinical Data: Weakness, abnormal labs  CHEST - 2 VIEW  Comparison: None.  Findings: Lungs are clear. No pleural effusion or pneumothorax.  Mild cardiomegaly.  Mild degenerative changes of the visualized thoracolumbar spine.  Vascular calcifications.  IMPRESSION: No evidence of acute cardiopulmonary disease.   Original Report Authenticated By: Charline Bills, M.D.    Ct Head Wo Contrast  12/26/2012  *RADIOLOGY  REPORT*  Clinical Data: 79 year old female with weakness.  Recent urinary tract infection.  CT HEAD WITHOUT CONTRAST  Technique:  Contiguous axial images were obtained from the base of the skull through the vertex without contrast.  Comparison: 07/25/2012 and earlier.  Findings: Visualized paranasal sinuses and mastoids are clear.  No acute osseous abnormality identified.  Visualized orbits and scalp soft tissues are within normal limits.  Calcified atherosclerosis at the skull base.  Cerebral volume is stable and normal for age.  No ventriculomegaly. No midline shift, mass effect, or evidence of mass lesion.  Gray-white matter differentiation is within normal limits throughout the brain.  No evidence of cortically based acute infarction identified.  No suspicious intracranial vascular hyperdensity. No acute intracranial hemorrhage identified.  IMPRESSION: Stable and normal for age noncontrast CT appearance of the brain.   Original Report Authenticated By: Erskine Speed, M.D.     Assessment/Plan:   1. Obstructing left ureteral stone with hydronephrosis, status post stenting 36 hours ago. She is not febrile, is on antibiotics    2. Renal insufficiency. Most likely multifactorial. Her creatinine has increased some, despite stent placement. We will check a renal ultrasound today to check for decompression of the left kidney. More than likely, her creatinine elevation is not primarily due to obstruction of the left kidney alone.   LOS: 2 days   Marcine Matar M 12/28/2012, 9:41 AM

## 2012-12-28 NOTE — Procedures (Signed)
I was present at this dialysis session. I have reviewed the session itself and made appropriate changes.   Vinson Moselle, MD [employer] 12/28/2012, 4:33 PM

## 2012-12-29 LAB — GLUCOSE, CAPILLARY: Glucose-Capillary: 130 mg/dL — ABNORMAL HIGH (ref 70–99)

## 2012-12-29 LAB — HEPATITIS B SURFACE ANTIBODY,QUALITATIVE: Hep B S Ab: NONREACTIVE

## 2012-12-29 MED ORDER — HEPARIN SODIUM (PORCINE) 1000 UNIT/ML DIALYSIS
1000.0000 [IU] | INTRAMUSCULAR | Status: DC | PRN
  Filled 2012-12-29: qty 1

## 2012-12-29 MED ORDER — HEPARIN SODIUM (PORCINE) 1000 UNIT/ML DIALYSIS: 2000.0000 [IU] | Freq: Once | INTRAMUSCULAR | Status: DC

## 2012-12-29 MED ORDER — PENTAFLUOROPROP-TETRAFLUOROETH EX AERO: 1.0000 "application " | INHALATION_SPRAY | CUTANEOUS | Status: DC | PRN

## 2012-12-29 MED ORDER — LIDOCAINE HCL (PF) 1 % IJ SOLN: 5.0000 mL | INTRAMUSCULAR | Status: DC | PRN

## 2012-12-29 MED ORDER — SODIUM CHLORIDE 0.9 % IV SOLN: 100.0000 mL | INTRAVENOUS | Status: DC | PRN

## 2012-12-29 MED ORDER — NEPRO/CARBSTEADY PO LIQD: 237.0000 mL | ORAL | Status: DC | PRN

## 2012-12-29 MED ORDER — LIDOCAINE-PRILOCAINE 2.5-2.5 % EX CREA: 1.0000 "application " | TOPICAL_CREAM | CUTANEOUS | Status: DC | PRN

## 2012-12-29 MED ORDER — CIPROFLOXACIN IN D5W 400 MG/200ML IV SOLN
400.0000 mg | INTRAVENOUS | Status: DC
  Administered 2012-12-29 – 2012-12-30 (×2): 400 mg via INTRAVENOUS
  Filled 2012-12-29 (×4): qty 200

## 2012-12-29 MED ORDER — ALTEPLASE 2 MG IJ SOLR
2.0000 mg | Freq: Once | INTRAMUSCULAR | Status: DC | PRN
  Filled 2012-12-29: qty 2

## 2012-12-29 NOTE — Progress Notes (Signed)
Received consult to change IV Zosyn to Cipro.  Scr is 5.4, CrCl is 6.7 mL/min.  Plan:  Cipro 400mg  IV daily Please contact pharmacy with questions.  Thanks!  Celedonio Miyamoto, PharmD, BCPS Clinical Pharmacist Pager [phone]

## 2012-12-29 NOTE — Progress Notes (Signed)
2 Days Post-Op Subjective: Patient sleeping  Objective: Vital signs in last 24 hours: Temp:  [97.5 F (36.4 C)-99.2 F (37.3 C)] 97.6 F (36.4 C) (04/14 0404) Pulse Rate:  [67-101] 67 (04/14 0404) Resp:  [5-23] 16 (04/14 0404) BP: (95-171)/(47-89) 147/47 mmHg (04/14 0404) SpO2:  [83 %-100 %] 95 % (04/14 0404) Weight:  [63.5 kg (139 lb 15.9 oz)-65.8 kg (145 lb 1 oz)] 63.5 kg (139 lb 15.9 oz) (04/14 0404)  Intake/Output from previous day: 04/13 0701 - 04/14 0700 In: 1783 [P.O.:1200; I.V.:483; IV Piggyback:100] Out: 4097 [Urine:2300; Stool:1] Intake/Output this shift: Total I/O In: 363 [P.O.:240; I.V.:73; IV Piggyback:50] Out: 3297 [Urine:1500; ZOXWR:6045; Stool:1]  Physical Exam:  Constitutional: Vital signs reviewed. WD WN in NAD   Eyes: PERRL, No scleral icterus.   Cardiovascular: RRR Pulmonary/Chest: Normal effort Abdominal: Soft. Non-tender, non-distended, bowel sounds are normal, no masses, organomegaly, or guarding present.  Genitourinary: Extremities: No cyanosis or edema   Lab Results:  Recent Labs  12/27/12 0831 12/28/12 0709 12/28/12 1031  HGB 10.4* 9.5* 9.6*  HCT 28.8* 27.1* 27.6*   BMET  Recent Labs  12/28/12 0709 12/28/12 1031  NA 131* 128*  K 4.7 4.7  CL 97 96  CO2 18* 15*  GLUCOSE 100* 181*  BUN 71* 71*  CREATININE 5.51* 5.37*  CALCIUM 8.4 8.3*   No results found for this basename: LABPT, INR,  in the last 72 hours No results found for this basename: LABURIN,  in the last 72 hours Results for orders placed during the hospital encounter of 12/26/12  URINE CULTURE     Status: None   Collection Time    12/26/12  9:34 PM      Result Value Range Status   Specimen Description URINE, RANDOM   Final   Special Requests CX ADDED AT 2154 ON [id number]   Final   Culture  Setup Time 12/27/2012 01:11   Final   Colony Count 75,000 COLONIES/ML   Final   Culture     Final   Value: Multiple bacterial morphotypes present, none predominant. Suggest  appropriate recollection if clinically indicated.   Report Status 12/28/2012 FINAL   Final  MRSA PCR SCREENING     Status: None   Collection Time    12/27/12  4:26 AM      Result Value Range Status   MRSA by PCR NEGATIVE  NEGATIVE Final   Comment:            The GeneXpert MRSA Assay (FDA     approved for NASAL specimens     only), is one component of a     comprehensive MRSA colonization     surveillance program. It is not     intended to diagnose MRSA     infection nor to guide or     monitor treatment for     MRSA infections.  CULTURE, BLOOD (ROUTINE X 2)     Status: None   Collection Time    12/27/12  8:31 AM      Result Value Range Status   Specimen Description BLOOD RIGHT ARM   Final   Special Requests BOTTLES DRAWN AEROBIC ONLY 10CC   Final   Culture  Setup Time 12/27/2012 15:06   Final   Culture     Final   Value:        BLOOD CULTURE RECEIVED NO GROWTH TO DATE CULTURE WILL BE HELD FOR 5 DAYS BEFORE ISSUING A FINAL NEGATIVE REPORT   Report Status  PENDING   Incomplete  CULTURE, BLOOD (ROUTINE X 2)     Status: None   Collection Time    12/27/12  8:37 AM      Result Value Range Status   Specimen Description BLOOD RIGHT WRIST   Final   Special Requests BOTTLES DRAWN AEROBIC ONLY 10CC   Final   Culture  Setup Time 12/27/2012 15:06   Final   Culture     Final   Value:        BLOOD CULTURE RECEIVED NO GROWTH TO DATE CULTURE WILL BE HELD FOR 5 DAYS BEFORE ISSUING A FINAL NEGATIVE REPORT   Report Status PENDING   Incomplete    Studies/Results: US Renal  12/28/2012  *RADIOLOGY REPORT*  Clinical Data: Follow up hydronephrosis. History of left ureteral stent placement.  RENAL/URINARY TRACT ULTRASOUND COMPLETE  Comparison:  Abdominal CT 12/26/2012  Findings:  Right Kidney:  The right kidney measures 11.8 cm in length without hydronephrosis.  There is mild cortical thinning and expansion of the central sinus complex.  Left Kidney:  Left kidney measures 13.0 cm in length.  There is  mild fullness of the mid and lower pole calyces.  There is also mild fullness of the renal pelvis. A ureteral stent is not confidently identified on these images.  Bladder:  Bladder is decompressed.  IMPRESSION: Mild left hydronephrosis.  Mild cortical thinning in both kidneys.   Original Report Authenticated By: Richarda Overlie, M.D.     Assessment/Plan:   Hospital day #3 for treatment of infected, obstructing left distal ureteral stone. Stent is in place. There is minimal left hydronephrosis, so I think that stent is working adequately. Acute renal failure at this point cannot be directly attributable to just at left ureteral stone. Nephrology is seeing. We will leave the stent in until her medical condition improves, and in several weeks look at removing the stone electively. I will sign off for now. We will arrange appropriate followup.   LOS: 3 days   Marcine Matar M 12/29/2012, 6:55 AM

## 2012-12-29 NOTE — Progress Notes (Signed)
Utilization Review Completed.  12/29/2012  

## 2012-12-29 NOTE — Progress Notes (Signed)
Subjective: Patient is pleasant, no distress. Note reviewed over the weekend. Nephrology input greatly appreciated as well as urology  Objective: Vital signs in last 24 hours: Temp:  [97.5 F (36.4 C)-99.2 F (37.3 C)] 98.2 F (36.8 C) (04/14 1153) Pulse Rate:  [66-101] 66 (04/14 1153) Resp:  [5-22] 18 (04/14 1153) BP: (95-171)/(47-105) 154/61 mmHg (04/14 1153) SpO2:  [83 %-100 %] 93 % (04/14 1153) Weight:  [63.5 kg (139 lb 15.9 oz)-65.8 kg (145 lb 1 oz)] 63.5 kg (139 lb 15.9 oz) (04/14 0404) Weight change: -1.2 kg (-2 lb 10.3 oz) Last BM Date: 12/28/12  Intake/Output from previous day: 04/13 0701 - 04/14 0700 In: 1793 [P.O.:1200; I.V.:493; IV Piggyback:100] Out: 4097 [Urine:2300; Stool:1] Intake/Output this shift: Total I/O In: 200 [P.O.:120; I.V.:30; IV Piggyback:50] Out: 550 [Urine:550]  General appearance: alert and cooperative Resp: clear to auscultation bilaterally Cardio: regular rate and rhythm, S1, S2 normal, no murmur, click, rub or gallop GI: abdomen soft, positive bowel sounds, no organomegaly Extremities: extremities normal, atraumatic, no cyanosis or edema  Lab Results:  Results for orders placed during the hospital encounter of 12/26/12 (from the past 24 hour(s))  HEPATITIS B SURFACE ANTIGEN     Status: None   Collection Time    12/28/12  4:39 PM      Result Value Range   Hepatitis B Surface Ag NEGATIVE  NEGATIVE  HEPATITIS B CORE ANTIBODY, TOTAL     Status: None   Collection Time    12/28/12  4:39 PM      Result Value Range   Hep B Core Total Ab NEGATIVE  NEGATIVE  GLUCOSE, CAPILLARY     Status: Abnormal   Collection Time    12/28/12  8:46 PM      Result Value Range   Glucose-Capillary 178 (*) 70 - 99 mg/dL  GLUCOSE, CAPILLARY     Status: Abnormal   Collection Time    12/29/12 12:28 AM      Result Value Range   Glucose-Capillary 128 (*) 70 - 99 mg/dL  GLUCOSE, CAPILLARY     Status: Abnormal   Collection Time    12/29/12  8:30 AM      Result  Value Range   Glucose-Capillary 130 (*) 70 - 99 mg/dL   Comment 1 Notify RN        Studies/Results: US Renal  12/28/2012  *RADIOLOGY REPORT*  Clinical Data: Follow up hydronephrosis. History of left ureteral stent placement.  RENAL/URINARY TRACT ULTRASOUND COMPLETE  Comparison:  Abdominal CT 12/26/2012  Findings:  Right Kidney:  The right kidney measures 11.8 cm in length without hydronephrosis.  There is mild cortical thinning and expansion of the central sinus complex.  Left Kidney:  Left kidney measures 13.0 cm in length.  There is mild fullness of the mid and lower pole calyces.  There is also mild fullness of the renal pelvis. A ureteral stent is not confidently identified on these images.  Bladder:  Bladder is decompressed.  IMPRESSION: Mild left hydronephrosis.  Mild cortical thinning in both kidneys.   Original Report Authenticated By: Richarda Overlie, M.D.     Medications:  Prior to Admission:  Prescriptions prior to admission  Medication Sig Dispense Refill  . ciprofloxacin (CIPRO) 250 MG tablet Take 250 mg by mouth 2 (two) times daily. Started 4.11.14 for 5 days, ending 4.16.14      . colesevelam (WELCHOL) 625 MG tablet Take 1,875 mg by mouth 3 (three) times daily.       Marland Kitchen  gabapentin (NEURONTIN) 300 MG capsule Take 300 mg by mouth 3 (three) times daily.      Marland Kitchen glipiZIDE (GLUCOTROL) 10 MG tablet Take 0.5 tablets (5 mg total) by mouth 2 (two) times daily before a meal.  6 tablet  0  . levothyroxine (SYNTHROID, LEVOTHROID) 88 MCG tablet Take 88 mcg by mouth daily before breakfast.      . lisinopril (PRINIVIL,ZESTRIL) 40 MG tablet Take 40 mg by mouth daily.      . metFORMIN (GLUCOPHAGE) 500 MG tablet Take 1,000 mg by mouth 2 (two) times daily with a meal.      . metoprolol (LOPRESSOR) 50 MG tablet Take 25 mg by mouth daily.      Marland Kitchen oxyCODONE-acetaminophen (PERCOCET/ROXICET) 5-325 MG per tablet Take 1 tablet by mouth every 4 (four) hours as needed for pain.      . tamsulosin (FLOMAX) 0.4 MG  CAPS Take 1 capsule (0.4 mg total) by mouth daily after supper.  5 capsule  0  . traMADol (ULTRAM) 50 MG tablet Take 50 mg by mouth every 6 (six) hours as needed for pain.       Scheduled: . docusate sodium  100 mg Oral BID  . insulin aspart  0-9 Units Subcutaneous Q4H  . metoprolol  12.5 mg Oral BID  . pantoprazole  40 mg Oral QAC supper  . piperacillin-tazobactam (ZOSYN)  IV  2.25 g Intravenous Q6H  . sodium chloride  3 mL Intravenous Q12H  . tamsulosin  0.4 mg Oral QPC supper   Continuous: . sodium chloride 20 mL/hr at 12/28/12 1826    Assessment/Plan: Acute renal failure secondary to contrast dye. Patient has received dialysis times one, continue treatment per nephrology Obstructive nephrolithiasis status post stent. Urology  input appreciated, stent functioning well,ultrasound shows mild hydronephrosis.  Hypertension UTI on broad-spectrum antibiotic, streamline based on culture. Patient afebrile, blood culture negative. Initial culture was positive for Klebsiella on April 6 which was pan sensitive. Chronic right shoulder pain Diabetes continue sliding scale insulin  LOS: 3 days   Suzana Sohail D 12/29/2012, 12:14 PM

## 2012-12-29 NOTE — Progress Notes (Signed)
Impression/Plan  1. Acute on CKD III due to IV contrast, obstruction, ACE-i Infection-, uremia improved; has right groin HD catheter. 1.  Will evaluate and do HD in AM and/or remove HD catheter.  If no improvement, the will need permcath in IJ discussed with daughter. 2. CKD IIIb- eGFR 35-40 at baseline  3. HTN/volume- was on lisinopril and metoprolol at home, just metoprolol here. BP 130/80 4. AMS- due to uremia improved 5. Klebs UTI from 12/21/12- urine cx here multiple morphotypes, blood cx's negative  Subjective:  Interval History: Appetite is poor, c/o malaise  Objective: Vital signs in last 24 hours: Temp:  [97.5 F (36.4 C)-99.2 F (37.3 C)] 97.8 F (36.6 C) (04/14 0833) Pulse Rate:  [67-101] 73 (04/14 0956) Resp:  [5-22] 17 (04/14 0833) BP: (95-171)/(47-105) 147/53 mmHg (04/14 0956) SpO2:  [83 %-100 %] 91 % (04/14 0833) Weight:  [63.5 kg (139 lb 15.9 oz)-65.8 kg (145 lb 1 oz)] 63.5 kg (139 lb 15.9 oz) (04/14 0404) Weight change: -1.2 kg (-2 lb 10.3 oz)  Intake/Output from previous day: 04/13 0701 - 04/14 0700 In: 1793 [P.O.:1200; I.V.:493; IV Piggyback:100] Out: 4097 [Urine:2300; Stool:1] Intake/Output this shift: Total I/O In: 200 [P.O.:120; I.V.:30; IV Piggyback:50] Out: -  General appearance: alert and cooperative Resp: clear to auscultation bilaterally Chest wall: no tenderness GI: abnormal findings:  mild tenderness left lower abdomen Extremities: extremities normal, atraumatic, no cyanosis or edema  Lab Results:  Recent Labs  12/28/12 0709 12/28/12 1031  WBC 12.4* 12.3*  HGB 9.5* 9.6*  HCT 27.1* 27.6*  PLT 156 148*   BMET:  Recent Labs  12/28/12 0709 12/28/12 1031  NA 131* 128*  K 4.7 4.7  CL 97 96  CO2 18* 15*  GLUCOSE 100* 181*  BUN 71* 71*  CREATININE 5.51* 5.37*  CALCIUM 8.4 8.3*   No results found for this basename: PTH,  in the last 72 hours Iron Studies: No results found for this basename: IRON, TIBC, TRANSFERRIN, FERRITIN,  in the  last 72 hours Studies/Results: US Renal  12/28/2012  *RADIOLOGY REPORT*  Clinical Data: Follow up hydronephrosis. History of left ureteral stent placement.  RENAL/URINARY TRACT ULTRASOUND COMPLETE  Comparison:  Abdominal CT 12/26/2012  Findings:  Right Kidney:  The right kidney measures 11.8 cm in length without hydronephrosis.  There is mild cortical thinning and expansion of the central sinus complex.  Left Kidney:  Left kidney measures 13.0 cm in length.  There is mild fullness of the mid and lower pole calyces.  There is also mild fullness of the renal pelvis. A ureteral stent is not confidently identified on these images.  Bladder:  Bladder is decompressed.  IMPRESSION: Mild left hydronephrosis.  Mild cortical thinning in both kidneys.   Original Report Authenticated By: Richarda Overlie, M.D.    Scheduled: . docusate sodium  100 mg Oral BID  . insulin aspart  0-9 Units Subcutaneous Q4H  . metoprolol  12.5 mg Oral BID  . pantoprazole  40 mg Oral QAC supper  . piperacillin-tazobactam (ZOSYN)  IV  2.25 g Intravenous Q6H  . sodium chloride  3 mL Intravenous Q12H  . tamsulosin  0.4 mg Oral QPC supper     LOS: 3 days   Gavriela Cashin C 12/29/2012,11:01 AM

## 2012-12-29 NOTE — Evaluation (Signed)
Occupational Therapy Evaluation Patient Details Name: Gloria Mcintosh MRN: [id number] DOB: 1925-02-21 Today's Date: 12/29/2012 Time: 8657-8469 OT Time Calculation (min): 32 min  OT Assessment / Plan / Recommendation Clinical Impression   79 y.o. Female admitted with Lt. Flank pain, blood on UA.  CT showed Lt. Hydronephrosis.  Pt underwent stenting.  Pt. Demonstrates decreased endurance, confusion, and impaired balance.  Pt. Lives at [facility] with husband in the independent living section. She will need to go to SNF rehab before returning to Independent living. She will benefit from OT to maximize safety and independence with the below listed deficits.    OT Assessment  Patient needs continued OT Services    Follow Up Recommendations  SNF    Barriers to Discharge Decreased caregiver support    Equipment Recommendations  None recommended by OT    Recommendations for Other Services    Frequency  Min 2X/week    Precautions / Restrictions Precautions Precautions: Fall Restrictions Weight Bearing Restrictions: No   Pertinent Vitals/Pain HR 77; O2 94-98 on RA    ADL  Eating/Feeding: Set up Where Assessed - Eating/Feeding: Chair Grooming: Wash/dry hands;Teeth care;Brushing hair;Minimal assistance Where Assessed - Grooming: Supported standing;Unsupported standing Upper Body Bathing: Moderate assistance Where Assessed - Upper Body Bathing: Unsupported sitting Lower Body Bathing: Moderate assistance Where Assessed - Lower Body Bathing: Unsupported sit to stand Upper Body Dressing: Moderate assistance Where Assessed - Upper Body Dressing: Unsupported sitting Lower Body Dressing: Maximal assistance Where Assessed - Lower Body Dressing: Unsupported sit to stand Toilet Transfer: Moderate assistance Toilet Transfer Method: Sit to stand Toilet Transfer Equipment: Comfort height toilet Toileting - Clothing Manipulation and Hygiene: Moderate assistance Where Assessed -  Toileting Clothing Manipulation and Hygiene: Standing Transfers/Ambulation Related to ADLs: Mod A HHA.  Pt. with heavy lean to Rt. ADL Comments: Pt lethargic.  Requires cues to complete tasks.  Pt. incontinent of small amount of stool, with no awareness.  Assisted pt with clean up.    OT Diagnosis: Generalized weakness;Cognitive deficits  OT Problem List: Decreased strength;Decreased activity tolerance;Impaired balance (sitting and/or standing);Decreased cognition;Decreased coordination;Decreased knowledge of use of DME or AE;Decreased safety awareness OT Treatment Interventions: Self-care/ADL training;DME and/or AE instruction;Therapeutic activities;Patient/family education;Balance training;Cognitive remediation/compensation   OT Goals Acute Rehab OT Goals OT Goal Formulation: With patient Time For Goal Achievement: 01/12/13 Potential to Achieve Goals: Good ADL Goals Pt Will Perform Grooming: with supervision;Standing at sink ADL Goal: Grooming - Progress: Goal set today Pt Will Perform Upper Body Bathing: with supervision;with set-up;Sitting, chair ADL Goal: Upper Body Bathing - Progress: Goal set today Pt Will Perform Lower Body Bathing: with supervision;Sit to stand from chair;Sit to stand from bed ADL Goal: Lower Body Bathing - Progress: Goal set today Pt Will Transfer to Toilet: with supervision;Ambulation;Comfort height toilet ADL Goal: Toilet Transfer - Progress: Goal set today Pt Will Perform Toileting - Clothing Manipulation: with supervision;Standing ADL Goal: Toileting - Clothing Manipulation - Progress: Goal set today Additional ADL Goal #1: Pt will actively participate in 25 mins therapeutic activities to increase endurance with no rest breaks ADL Goal: Additional Goal #1 - Progress: Goal set today  Visit Information  Last OT Received On: 12/29/12 Assistance Needed: +1    Subjective Data  Subjective: "It's 1952" Patient Stated Goal: Pt did not state   Prior  Functioning     Home Living Lives With: Spouse Available Help at Discharge: Skilled Nursing Facility Type of Home: Apartment Home Access: Level entry Home Layout: One level Bathroom Shower/Tub: Walk-in  shower Bathroom Toilet: Handicapped height Bathroom Accessibility: Yes How Accessible: Accessible via wheelchair;Accessible via walker Home Adaptive Equipment: Walker - rolling;Grab bars in shower;Grab bars around toilet Additional Comments: Pt lives at [facility] in the independent living apartments.  Husband has had a CVA Prior Function Level of Independence: Independent with assistive device(s) Driving: Yes Vocation: Retired Comments: Pt prepares breakfast and eats meals in dining room Communication Communication: No difficulties Dominant Hand: Right         Vision/Perception Vision - Assessment Vision Assessment: Vision not tested Perception Perception: Within Functional Limits Praxis Praxis: Intact   Cognition  Cognition Overall Cognitive Status: Impaired Area of Impairment: Attention;Memory;Safety/judgement;Awareness of errors Arousal/Alertness: Lethargic Orientation Level: Time (states it's 1952) Behavior During Session: [facility] for tasks performed Current Attention Level: Sustained Attention - Other Comments: Pt lethargic with difficulty maintaining full attention Awareness of Errors: Assistance required to identify errors made;Assistance required to correct errors made Cognition - Other Comments: Pt with confusion.  She had difficulty answering questions regarding home situation    Extremity/Trunk Assessment Right Upper Extremity Assessment RUE ROM/Strength/Tone: Deficits RUE ROM/Strength/Tone Deficits: limited at shoulders due to h/o arthritis RUE Coordination: Deficits RUE Coordination Deficits: tremor noted Left Upper Extremity Assessment LUE ROM/Strength/Tone: Deficits LUE ROM/Strength/Tone Deficits: limited at shoulders due to h/o arthritis LUE  Coordination: Deficits LUE Coordination Deficits: tremor noted Trunk Assessment Trunk Assessment: Normal     Mobility Bed Mobility Bed Mobility: Supine to Sit;Sitting - Scoot to Edge of Bed Supine to Sit: 3: Mod assist;HOB flat Sitting - Scoot to Edge of Bed: 3: Mod assist Details for Bed Mobility Assistance: Pt requires verbal cues and physical assist due to lethargy.  Pt will initiate task then stop and close  eyes Transfers Transfers: Sit to Stand;Stand to Sit Sit to Stand: 3: Mod assist;With upper extremity assist;From bed;4: Min assist (min A from chair) Stand to Sit: 4: Min assist;With upper extremity assist;To chair/3-in-1 Details for Transfer Assistance: Pt with heavy lean to Rt.      Exercise     Balance Balance Balance Assessed: Yes Dynamic Standing Balance Dynamic Standing - Balance Support: No upper extremity supported Dynamic Standing - Level of Assistance: 3: Mod assist Dynamic Standing - Comments: Pt initially required mod A progressing to min A while standing at sink to brush teeth.  Pt. leaning to Rt.    End of Session OT - End of Session Activity Tolerance: Patient limited by fatigue Patient left: in chair;with call bell/phone within reach Nurse Communication: Mobility status  GO     Jeani Hawking M 12/29/2012, 10:58 AM

## 2012-12-29 NOTE — Clinical Social Work Psychosocial (Signed)
     Clinical Social Work Department BRIEF PSYCHOSOCIAL ASSESSMENT 12/29/2012  Patient:  Gloria Mcintosh, Gloria Mcintosh     Account Number:  [id number]     Admit date:  12/26/2012  Clinical Social Worker:  Margaree Mackintosh  Date/Time:  12/29/2012 12:00 M  Referred by:  Physician  Date Referred:  12/29/2012 Referred for  SNF Placement   Other Referral:   Interview type:  Family Other interview type:    PSYCHOSOCIAL DATA Living Status:  FACILITY Admitted from facility:  [facility] Level of care:  Independent Living Primary support name:  Marites Nath: [phone] Primary support relationship to patient:  CHILD, ADULT Degree of support available:   Adequate.    CURRENT CONCERNS Current Concerns  Post-Acute Placement   Other Concerns:    SOCIAL WORK ASSESSMENT / PLAN Clinical Social Worker received referral indicating pt is from [facility] and PT/OT currently recommending SNF. CSW reviewed chart and spoke with pt's son.  CSW introduced self, explained role, and provided support.  Son interested in pt transferring to [facility] for ST-rehab.  CSW spoke with Candice at [facility] who stated they do have SNF beds available for pt.  CSW submitted appropriate clincial information to SNF.  CSW to continue to follow and assist as needed.   Assessment/plan status:  Information/Referral to [facility] Other assessment/ plan:   Information/referral to community resources:   SNF    PATIENTS/FAMILYS RESPONSE TO PLAN OF CARE: Pt currently in procedure.  Son thanked CSW for intervention.

## 2012-12-29 NOTE — Procedures (Signed)
Having  dialysis via femoral catheter. Hemodynamically stable. No acute events noted. Will reevaluate in AM. Salmaan Patchin C

## 2012-12-30 ENCOUNTER — Encounter (HOSPITAL_COMMUNITY): Payer: Self-pay | Admitting: Urology

## 2012-12-30 LAB — BASIC METABOLIC PANEL
BUN: 42 mg/dL — ABNORMAL HIGH (ref 6–23)
CO2: 24 mEq/L (ref 19–32)
Chloride: 99 mEq/L (ref 96–112)
Glucose, Bld: 160 mg/dL — ABNORMAL HIGH (ref 70–99)
Potassium: 4 mEq/L (ref 3.5–5.1)
Sodium: 137 mEq/L (ref 135–145)

## 2012-12-30 LAB — GLUCOSE, CAPILLARY: Glucose-Capillary: 163 mg/dL — ABNORMAL HIGH (ref 70–99)

## 2012-12-30 NOTE — Progress Notes (Signed)
Impression/Plan  1. Acute on CKD III due to IV contrast, obstruction s/p left ureteral stent on 4/11, ACE-i Infection-; has right groin HD catheter.  Good urine out put.  Will remove foley and HD catheter and reassess the need for permcath (discussed with daughter yesterday) in am 4/16. 2. CKD IIIb- eGFR 35-40 at baseline  3. HTN/volume- was on lisinopril and metoprolol at home, just metoprolol here. 4. AMS- improved 5. Klebs UTI from 12/21/12- urine cx here multiple morphotypes, blood cx's negative   Subjective: Interval History: none.  Objective: Vital signs in last 24 hours: Temp:  [97.4 F (36.3 C)-98.7 F (37.1 C)] 98 F (36.7 C) (04/15 0319) Pulse Rate:  [62-82] 62 (04/15 0400) Resp:  [15-22] 17 (04/15 0400) BP: (120-183)/(53-105) 163/59 mmHg (04/15 0400) SpO2:  [77 %-99 %] 95 % (04/15 0400) Weight:  [63.2 kg (139 lb 5.3 oz)-64.3 kg (141 lb 12.1 oz)] 63.2 kg (139 lb 5.3 oz) (04/14 2357) Weight change: -1.5 kg (-3 lb 4.9 oz)  Intake/Output from previous day: 04/14 0701 - 04/15 0700 In: 358 [P.O.:180; I.V.:128; IV Piggyback:50] Out: 1725 [Urine:1900; Stool:1] Intake/Output this shift:    General appearance: alert and cooperative Resp: clear to auscultation bilaterally Chest wall: no tenderness Cardio: regular rate and rhythm, S1, S2 normal, no murmur, click, rub or gallop Extremities: extremities normal, atraumatic, no cyanosis or edema Appropriate responses  Lab Results:  Recent Labs  12/28/12 0709 12/28/12 1031  WBC 12.4* 12.3*  HGB 9.5* 9.6*  HCT 27.1* 27.6*  PLT 156 148*   BMET:  Recent Labs  12/28/12 0709 12/28/12 1031  NA 131* 128*  K 4.7 4.7  CL 97 96  CO2 18* 15*  GLUCOSE 100* 181*  BUN 71* 71*  CREATININE 5.51* 5.37*  CALCIUM 8.4 8.3*   No results found for this basename: PTH,  in the last 72 hours Iron Studies: No results found for this basename: IRON, TIBC, TRANSFERRIN, FERRITIN,  in the last 72 hours Studies/Results: US  Renal  12/28/2012  *RADIOLOGY REPORT*  Clinical Data: Follow up hydronephrosis. History of left ureteral stent placement.  RENAL/URINARY TRACT ULTRASOUND COMPLETE  Comparison:  Abdominal CT 12/26/2012  Findings:  Right Kidney:  The right kidney measures 11.8 cm in length without hydronephrosis.  There is mild cortical thinning and expansion of the central sinus complex.  Left Kidney:  Left kidney measures 13.0 cm in length.  There is mild fullness of the mid and lower pole calyces.  There is also mild fullness of the renal pelvis. A ureteral stent is not confidently identified on these images.  Bladder:  Bladder is decompressed.  IMPRESSION: Mild left hydronephrosis.  Mild cortical thinning in both kidneys.   Original Report Authenticated By: Richarda Overlie, M.D.    Scheduled: . ciprofloxacin  400 mg Intravenous Q24H  . docusate sodium  100 mg Oral BID  . insulin aspart  0-9 Units Subcutaneous Q4H  . metoprolol  12.5 mg Oral BID  . pantoprazole  40 mg Oral QAC supper  . sodium chloride  3 mL Intravenous Q12H  . tamsulosin  0.4 mg Oral QPC supper     LOS: 4 days   Sircharles Holzheimer C 12/30/2012,7:51 AM

## 2012-12-30 NOTE — Progress Notes (Signed)
Subjective: Patient without any problems overnight no fever no chills, still complains of some vague abdominal discomfort. Followup creatinine improved. Patient tolerating p.o.  Objective: Vital signs in last 24 hours: Temp:  [97.4 F (36.3 C)-98.7 F (37.1 C)] 97.9 F (36.6 C) (04/15 1200) Pulse Rate:  [62-82] 63 (04/15 1200) Resp:  [15-22] 20 (04/15 1200) BP: (120-183)/(49-88) 169/67 mmHg (04/15 1200) SpO2:  [77 %-99 %] 96 % (04/15 1200) Weight:  [63.2 kg (139 lb 5.3 oz)-64.3 kg (141 lb 12.1 oz)] 63.2 kg (139 lb 5.3 oz) (04/14 2357) Weight change: -1.5 kg (-3 lb 4.9 oz) Last BM Date: 12/28/12  Intake/Output from previous day: 04/14 0701 - 04/15 0700 In: 358 [P.O.:180; I.V.:128; IV Piggyback:50] Out: 1725 [Urine:1900; Stool:1] Intake/Output this shift: Total I/O In: 240 [P.O.:240] Out: 350 [Urine:350]  General appearance: alert and cooperative Resp: clear to auscultation bilaterally Cardio: regular rate and rhythm, S1, S2 normal, no murmur, click, rub or gallop Extremities: extremities normal, atraumatic, no cyanosis or edema  Lab Results:  Results for orders placed during the hospital encounter of 12/26/12 (from the past 24 hour(s))  GLUCOSE, CAPILLARY     Status: Abnormal   Collection Time    12/29/12  5:50 PM      Result Value Range   Glucose-Capillary 115 (*) 70 - 99 mg/dL  GLUCOSE, CAPILLARY     Status: Abnormal   Collection Time    12/29/12  7:37 PM      Result Value Range   Glucose-Capillary 255 (*) 70 - 99 mg/dL  GLUCOSE, CAPILLARY     Status: Abnormal   Collection Time    12/29/12 11:55 PM      Result Value Range   Glucose-Capillary 110 (*) 70 - 99 mg/dL  GLUCOSE, CAPILLARY     Status: Abnormal   Collection Time    12/30/12  3:35 AM      Result Value Range   Glucose-Capillary 121 (*) 70 - 99 mg/dL  BASIC METABOLIC PANEL     Status: Abnormal   Collection Time    12/30/12  8:41 AM      Result Value Range   Sodium 137  135 - 145 mEq/L   Potassium 4.0   3.5 - 5.1 mEq/L   Chloride 99  96 - 112 mEq/L   CO2 24  19 - 32 mEq/L   Glucose, Bld 160 (*) 70 - 99 mg/dL   BUN 42 (*) 6 - 23 mg/dL   Creatinine, Ser 9.56 (*) 0.50 - 1.10 mg/dL   Calcium 9.1  8.4 - 21.3 mg/dL   GFR calc non Af Amer 11 (*) >90 mL/min   GFR calc Af Amer 13 (*) >90 mL/min  GLUCOSE, CAPILLARY     Status: Abnormal   Collection Time    12/30/12  9:00 AM      Result Value Range   Glucose-Capillary 160 (*) 70 - 99 mg/dL   Comment 1 Notify RN        Studies/Results: US Renal  12/28/2012  *RADIOLOGY REPORT*  Clinical Data: Follow up hydronephrosis. History of left ureteral stent placement.  RENAL/URINARY TRACT ULTRASOUND COMPLETE  Comparison:  Abdominal CT 12/26/2012  Findings:  Right Kidney:  The right kidney measures 11.8 cm in length without hydronephrosis.  There is mild cortical thinning and expansion of the central sinus complex.  Left Kidney:  Left kidney measures 13.0 cm in length.  There is mild fullness of the mid and lower pole calyces.  There is also mild  fullness of the renal pelvis. A ureteral stent is not confidently identified on these images.  Bladder:  Bladder is decompressed.  IMPRESSION: Mild left hydronephrosis.  Mild cortical thinning in both kidneys.   Original Report Authenticated By: Richarda Overlie, M.D.     Medications:  Prior to Admission:  Prescriptions prior to admission  Medication Sig Dispense Refill  . ciprofloxacin (CIPRO) 250 MG tablet Take 250 mg by mouth 2 (two) times daily. Started 4.11.14 for 5 days, ending 4.16.14      . colesevelam (WELCHOL) 625 MG tablet Take 1,875 mg by mouth 3 (three) times daily.       Marland Kitchen gabapentin (NEURONTIN) 300 MG capsule Take 300 mg by mouth 3 (three) times daily.      Marland Kitchen glipiZIDE (GLUCOTROL) 10 MG tablet Take 0.5 tablets (5 mg total) by mouth 2 (two) times daily before a meal.  6 tablet  0  . levothyroxine (SYNTHROID, LEVOTHROID) 88 MCG tablet Take 88 mcg by mouth daily before breakfast.      . lisinopril  (PRINIVIL,ZESTRIL) 40 MG tablet Take 40 mg by mouth daily.      . metFORMIN (GLUCOPHAGE) 500 MG tablet Take 1,000 mg by mouth 2 (two) times daily with a meal.      . metoprolol (LOPRESSOR) 50 MG tablet Take 25 mg by mouth daily.      Marland Kitchen oxyCODONE-acetaminophen (PERCOCET/ROXICET) 5-325 MG per tablet Take 1 tablet by mouth every 4 (four) hours as needed for pain.      . tamsulosin (FLOMAX) 0.4 MG CAPS Take 1 capsule (0.4 mg total) by mouth daily after supper.  5 capsule  0  . traMADol (ULTRAM) 50 MG tablet Take 50 mg by mouth every 6 (six) hours as needed for pain.       Scheduled: . ciprofloxacin  400 mg Intravenous Q24H  . docusate sodium  100 mg Oral BID  . insulin aspart  0-9 Units Subcutaneous Q4H  . metoprolol  12.5 mg Oral BID  . pantoprazole  40 mg Oral QAC supper  . sodium chloride  3 mL Intravenous Q12H  . tamsulosin  0.4 mg Oral QPC supper   Continuous: . sodium chloride 10 mL/hr at 12/29/12 2030    Assessment/Plan: Acute renal failure secondary to contrast dye. Patient has received dialysis times one. Nephrology improved greatly appreciated, renal function shows improvement we will continue to follow Obstructive nephrolithiasis status post stent. Urology input appreciated, stent functioning well,ultrasound shows mild hydronephrosis.  Hypertension  UTI on broad-spectrum antibiotic, streamline based on culture. Patient afebrile, blood culture negative. Initial culture was positive for Klebsiella on April 6 which was pan sensitive. Doubt prolonged antibiotics will be indicated Chronic right shoulder pain  Diabetes continue sliding scale insulin Patient appears stable for medical floor bed   LOS: 4 days   Jovin Fester D 12/30/2012, 12:45 PM

## 2012-12-30 NOTE — Progress Notes (Signed)
Right femoral  HD trauma cath removed per MD order - pressure to site x 15 minutes - vaseline gauze pressure dressing to site. Patient instructed to place pressure over site if coughing, RN aware removal

## 2012-12-30 NOTE — Progress Notes (Signed)
Patient ID: Gloria Mcintosh, female   DOB: August 22, 1925, 79 y.o.   MRN: [id number] Medically stable for transfer to telemetry floor bed. See previous note from today for further details

## 2012-12-31 LAB — CBC
MCHC: 34.5 g/dL (ref 30.0–36.0)
Platelets: 213 10*3/uL (ref 150–400)
RDW: 13.6 % (ref 11.5–15.5)
WBC: 12.7 10*3/uL — ABNORMAL HIGH (ref 4.0–10.5)

## 2012-12-31 LAB — GLUCOSE, CAPILLARY
Glucose-Capillary: 214 mg/dL — ABNORMAL HIGH (ref 70–99)
Glucose-Capillary: 212 mg/dL — ABNORMAL HIGH (ref 70–99)
Glucose-Capillary: 173 mg/dL — ABNORMAL HIGH (ref 70–99)

## 2012-12-31 LAB — RENAL FUNCTION PANEL
Albumin: 2.1 g/dL — ABNORMAL LOW (ref 3.5–5.2)
BUN: 47 mg/dL — ABNORMAL HIGH (ref 6–23)
Chloride: 101 mEq/L (ref 96–112)
Creatinine, Ser: 3.57 mg/dL — ABNORMAL HIGH (ref 0.50–1.10)
GFR calc non Af Amer: 11 mL/min — ABNORMAL LOW (ref >90)
Phosphorus: 4.7 mg/dL — ABNORMAL HIGH (ref 2.3–4.6)
Potassium: 3.8 mEq/L (ref 3.5–5.1)

## 2012-12-31 MED ORDER — CIPROFLOXACIN HCL 500 MG PO TABS
500.0000 mg | ORAL_TABLET | Freq: Every day | ORAL | Status: DC
  Administered 2012-12-31: 500 mg via ORAL
  Filled 2012-12-31 (×2): qty 1

## 2012-12-31 MED ORDER — METOPROLOL TARTRATE 25 MG PO TABS
25.0000 mg | ORAL_TABLET | Freq: Two times a day (BID) | ORAL | Status: DC
  Filled 2012-12-31 (×2): qty 1

## 2012-12-31 MED ORDER — AMLODIPINE BESYLATE 5 MG PO TABS
5.0000 mg | ORAL_TABLET | Freq: Every day | ORAL | Status: DC
  Administered 2012-12-31 – 2013-01-03 (×4): 5 mg via ORAL
  Filled 2012-12-31 (×4): qty 1

## 2012-12-31 MED ORDER — METOPROLOL TARTRATE 50 MG PO TABS
50.0000 mg | ORAL_TABLET | Freq: Two times a day (BID) | ORAL | Status: DC
  Administered 2012-12-31 – 2013-01-03 (×6): 50 mg via ORAL
  Filled 2012-12-31 (×8): qty 1

## 2012-12-31 MED ORDER — AMLODIPINE BESYLATE 5 MG PO TABS
5.0000 mg | ORAL_TABLET | Freq: Once | ORAL | Status: AC
  Administered 2012-12-31: 5 mg via ORAL
  Filled 2012-12-31: qty 1

## 2012-12-31 NOTE — Progress Notes (Signed)
Impression/Plan  1. Acute on CKD III due to IV contrast, obstruction s/p left ureteral stent on 4/11, ACE-i Infection-; has right groin HD catheter. Good urine out put.  Will follow for now without current plans for dialysis or HD catheter 2. CKD IIIb- eGFR 35-40 at baseline  3. HTN- was on lisinopril and metoprolol at home, just metoprolol here.  Add amlodipine. 4. AMS- improved 5. Klebs UTI from 12/21/12- urine cx here multiple morphotypes, blood cx's negative   Subjective: Interval History: HD catheter removed  Objective: Vital signs in last 24 hours: Temp:  [97.8 F (36.6 C)-98.1 F (36.7 C)] 97.9 F (36.6 C) (04/16 0741) Pulse Rate:  [63-78] 78 (04/16 0901) Resp:  [15-20] 17 (04/16 0741) BP: (166-195)/(60-81) 166/69 mmHg (04/16 0901) SpO2:  [93 %-96 %] 96 % (04/16 0741) Weight:  [62.2 kg (137 lb 2 oz)] 62.2 kg (137 lb 2 oz) (04/15 2329) Weight change: -2.1 kg (-4 lb 10.1 oz)  Intake/Output from previous day: 04/15 0701 - 04/16 0700 In: 663 [P.O.:660; I.V.:3] Out: 900 [Urine:900] Intake/Output this shift: Total I/O In: -  Out: 250 [Urine:250]  General appearance: alert and cooperative Resp: clear to auscultation bilaterally Chest wall: no tenderness Cardio: regular rate and rhythm, S1, S2 normal, no murmur, click, rub or gallop Extremities: extremities normal, atraumatic, no cyanosis or edema  Lab Results:  Recent Labs  12/31/12 0425  WBC 12.7*  HGB 10.6*  HCT 30.7*  PLT 213   BMET:  Recent Labs  12/30/12 0841 12/31/12 0425  NA 137 138  K 4.0 3.8  CL 99 101  CO2 24 24  GLUCOSE 160* 146*  BUN 42* 47*  CREATININE 3.44* 3.57*  CALCIUM 9.1 8.8   No results found for this basename: PTH,  in the last 72 hours Iron Studies: No results found for this basename: IRON, TIBC, TRANSFERRIN, FERRITIN,  in the last 72 hours Studies/Results: No results found.  Scheduled: . ciprofloxacin  400 mg Intravenous Q24H  . docusate sodium  100 mg Oral BID  . insulin  aspart  0-9 Units Subcutaneous Q4H  . metoprolol  50 mg Oral BID  . pantoprazole  40 mg Oral QAC supper  . sodium chloride  3 mL Intravenous Q12H  . tamsulosin  0.4 mg Oral QPC supper     LOS: 5 days   Larnie Heart C 12/31/2012,11:07 AM

## 2012-12-31 NOTE — Progress Notes (Signed)
Subjective: Patient's blood pressure has crept up a little last night, medication has been titrated otherwise no new complaints.  Objective: Vital signs in last 24 hours: Temp:  [97.8 F (36.6 C)-98.2 F (36.8 C)] 98.2 F (36.8 C) (04/16 1205) Pulse Rate:  [58-78] 58 (04/16 1205) Resp:  [15-19] 17 (04/16 1205) BP: (166-195)/(60-81) 178/80 mmHg (04/16 1205) SpO2:  [93 %-96 %] 96 % (04/16 1205) Weight:  [62.2 kg (137 lb 2 oz)] 62.2 kg (137 lb 2 oz) (04/15 2329) Weight change: -2.1 kg (-4 lb 10.1 oz) Last BM Date: 12/30/12  Intake/Output from previous day: 04/15 0701 - 04/16 0700 In: 663 [P.O.:660; I.V.:3] Out: 900 [Urine:900] Intake/Output this shift: Total I/O In: -  Out: 250 [Urine:250]  General appearance: alert and cooperative Resp: clear to auscultation bilaterally Cardio: regular rate and rhythm, S1, S2 normal, no murmur, click, rub or gallop Extremities: extremities normal, atraumatic, no cyanosis or edema  Lab Results:  Results for orders placed during the hospital encounter of 12/26/12 (from the past 24 hour(s))  GLUCOSE, CAPILLARY     Status: Abnormal   Collection Time    12/30/12 12:32 PM      Result Value Range   Glucose-Capillary 163 (*) 70 - 99 mg/dL   Comment 1 Notify RN    GLUCOSE, CAPILLARY     Status: Abnormal   Collection Time    12/30/12  4:19 PM      Result Value Range   Glucose-Capillary 206 (*) 70 - 99 mg/dL   Comment 1 Notify RN    GLUCOSE, CAPILLARY     Status: Abnormal   Collection Time    12/30/12  8:21 PM      Result Value Range   Glucose-Capillary 272 (*) 70 - 99 mg/dL  GLUCOSE, CAPILLARY     Status: Abnormal   Collection Time    12/30/12 11:54 PM      Result Value Range   Glucose-Capillary 214 (*) 70 - 99 mg/dL  GLUCOSE, CAPILLARY     Status: Abnormal   Collection Time    12/31/12  3:28 AM      Result Value Range   Glucose-Capillary 131 (*) 70 - 99 mg/dL  CBC     Status: Abnormal   Collection Time    12/31/12  4:25 AM   Result Value Range   WBC 12.7 (*) 4.0 - 10.5 K/uL   RBC 3.39 (*) 3.87 - 5.11 MIL/uL   Hemoglobin 10.6 (*) 12.0 - 15.0 g/dL   HCT 16.1 (*) 09.6 - 04.5 %   MCV 90.6  78.0 - 100.0 fL   MCH 31.3  26.0 - 34.0 pg   MCHC 34.5  30.0 - 36.0 g/dL   RDW 40.9  81.1 - 91.4 %   Platelets 213  150 - 400 K/uL  RENAL FUNCTION PANEL     Status: Abnormal   Collection Time    12/31/12  4:25 AM      Result Value Range   Sodium 138  135 - 145 mEq/L   Potassium 3.8  3.5 - 5.1 mEq/L   Chloride 101  96 - 112 mEq/L   CO2 24  19 - 32 mEq/L   Glucose, Bld 146 (*) 70 - 99 mg/dL   BUN 47 (*) 6 - 23 mg/dL   Creatinine, Ser 7.82 (*) 0.50 - 1.10 mg/dL   Calcium 8.8  8.4 - 95.6 mg/dL   Phosphorus 4.7 (*) 2.3 - 4.6 mg/dL   Albumin 2.1 (*) 3.5 -  5.2 g/dL   GFR calc non Af Amer 11 (*) >90 mL/min   GFR calc Af Amer 12 (*) >90 mL/min  GLUCOSE, CAPILLARY     Status: Abnormal   Collection Time    12/31/12  7:38 AM      Result Value Range   Glucose-Capillary 183 (*) 70 - 99 mg/dL   Comment 1 Notify RN        Studies/Results: No results found.  Medications:  Prior to Admission:  Prescriptions prior to admission  Medication Sig Dispense Refill  . ciprofloxacin (CIPRO) 250 MG tablet Take 250 mg by mouth 2 (two) times daily. Started 4.11.14 for 5 days, ending 4.16.14      . colesevelam (WELCHOL) 625 MG tablet Take 1,875 mg by mouth 3 (three) times daily.       Marland Kitchen gabapentin (NEURONTIN) 300 MG capsule Take 300 mg by mouth 3 (three) times daily.      Marland Kitchen glipiZIDE (GLUCOTROL) 10 MG tablet Take 0.5 tablets (5 mg total) by mouth 2 (two) times daily before a meal.  6 tablet  0  . levothyroxine (SYNTHROID, LEVOTHROID) 88 MCG tablet Take 88 mcg by mouth daily before breakfast.      . lisinopril (PRINIVIL,ZESTRIL) 40 MG tablet Take 40 mg by mouth daily.      . metFORMIN (GLUCOPHAGE) 500 MG tablet Take 1,000 mg by mouth 2 (two) times daily with a meal.      . metoprolol (LOPRESSOR) 50 MG tablet Take 25 mg by mouth daily.       Marland Kitchen oxyCODONE-acetaminophen (PERCOCET/ROXICET) 5-325 MG per tablet Take 1 tablet by mouth every 4 (four) hours as needed for pain.      . tamsulosin (FLOMAX) 0.4 MG CAPS Take 1 capsule (0.4 mg total) by mouth daily after supper.  5 capsule  0  . traMADol (ULTRAM) 50 MG tablet Take 50 mg by mouth every 6 (six) hours as needed for pain.       Scheduled: . amLODipine  5 mg Oral Daily  . ciprofloxacin  400 mg Intravenous Q24H  . docusate sodium  100 mg Oral BID  . insulin aspart  0-9 Units Subcutaneous Q4H  . metoprolol  50 mg Oral BID  . pantoprazole  40 mg Oral QAC supper  . sodium chloride  3 mL Intravenous Q12H  . tamsulosin  0.4 mg Oral QPC supper   Continuous: . sodium chloride 10 mL/hr at 12/29/12 2030    Assessment/Plan: Acute renal failure secondary to contrast dye. Patient has received dialysis times one. Nephrology input greatly appreciated, renal function shows slow recovery Obstructive nephrolithiasis status post stent. Urology input appreciated, stent functioning well,ultrasound shows mild hydronephrosis.  Hypertension , Greater than optimal, meds titrated UTI on broad-spectrum antibiotic, streamline based on culture. Patient afebrile, blood culture negative. Initial culture was positive for Klebsiella on April 6 which was pan sensitive. Chronic right shoulder pain  Diabetes continue sliding scale insulin  Patient appears stable for medical floor bed, start PT, mobilize   LOS: 5 days   Latifah Padin D 12/31/2012, 12:11 PM

## 2012-12-31 NOTE — Progress Notes (Signed)
Pt. transferred to [id number] from [id number]. Son Lyda Jester) with pt.

## 2012-12-31 NOTE — Progress Notes (Signed)
Physical Therapy Treatment Patient Details Name: Gloria Mcintosh MRN: [id number] DOB: 01-08-1925 Today's Date: 12/31/2012 Time: 8295-6213 PT Time Calculation (min): 20 min  PT Assessment / Plan / Recommendation Comments on Treatment Session  79 y.o. female admitted to [facility] for acute renal failure.  She now has a temporary HD catheter in her right groin which is preventing [place] from working with her from PT.  I just happened to be in her room when she needed to go to the bathroom and RN staff have been getting her on BSC.  I tried to limit R hip flexion as much as possible during mobility.  We really should not work with this patient until her temporary HD catheter is removed from the right leg.     Follow Up Recommendations  SNF (depends on progress and available assist at home)     Does the patient have the potential to tolerate intense rehabilitation    NA  Barriers to Discharge   none      Equipment Recommendations  None recommended by PT    Recommendations for Other Services   none  Frequency Min 3X/week   Plan Discharge plan remains appropriate;Frequency remains appropriate    Precautions / Restrictions Precautions Precautions: Fall   Pertinent Vitals/Pain No reports of pain, VSS    Mobility  Bed Mobility Supine to Sit: 3: Mod assist;With rails;HOB elevated Sitting - Scoot to Edge of Bed: 4: Min assist;With rail Details for Bed Mobility Assistance: mod assist to support trunk to get to sitting, min assist to prevent posterior LOB while getting to EOB.   Transfers Transfers: Sit to Stand;Stand to Sit;Stand Pivot Transfers Sit to Stand: 4: Min assist;From elevated surface;With upper extremity assist;With armrests;From bed;From chair/3-in-1 Stand to Sit: 4: Min assist;With upper extremity assist;With armrests;To elevated surface;To bed;To chair/3-in-1 Stand Pivot Transfers: 4: Min assist;With armrests Details for Transfer Assistance: min assist to stand and pivot to  3-in-1 BSC for toileting.   Ambulation/Gait Ambulation/Gait Assistance: Not tested (comment) (due to R groin temp HD catheter, need to limit hip flexion)     PT Goals Acute Rehab PT Goals PT Goal: Supine/Side to Sit - Progress: Progressing toward goal PT Goal: Sit to Supine/Side - Progress: Progressing toward goal PT Goal: Sit to Stand - Progress: Progressing toward goal PT Goal: Stand to Sit - Progress: Progressing toward goal PT Transfer Goal: Bed to Chair/Chair to Bed - Progress: Progressing toward goal  Visit Information  Last PT Received On: 12/31/12 Assistance Needed: +1    Subjective Data  Subjective: Pt reports "i don't know" when asked about the line in her left groin.  "You're going to need help"   Cognition  Cognition Arousal/Alertness: Awake/alert Behavior During Therapy: WFL for tasks assessed/performed Overall Cognitive Status: Impaired/Different from baseline Orientation Level: Situation;Time;Disoriented to Current Attention Level: Sustained Memory: Decreased short-term memory       End of Session PT - End of Session Activity Tolerance: Patient limited by fatigue Patient left: in bed;with call bell/phone within reach;with bed alarm set     Faige Seely B. Alexya Mcdaris, PT, DPT [phone]   12/31/2012, 4:44 PM

## 2012-12-31 NOTE — Progress Notes (Signed)
BP 187/78, Dr. Nehemiah Settle called about BP trending upwards. New orders carried out.

## 2013-01-01 LAB — GLUCOSE, CAPILLARY
Glucose-Capillary: 155 mg/dL — ABNORMAL HIGH (ref 70–99)
Glucose-Capillary: 166 mg/dL — ABNORMAL HIGH (ref 70–99)

## 2013-01-01 LAB — RENAL FUNCTION PANEL
Albumin: 2.4 g/dL — ABNORMAL LOW (ref 3.5–5.2)
CO2: 24 mEq/L (ref 19–32)
Chloride: 101 mEq/L (ref 96–112)
Creatinine, Ser: 3.49 mg/dL — ABNORMAL HIGH (ref 0.50–1.10)
GFR calc non Af Amer: 11 mL/min — ABNORMAL LOW (ref >90)
Potassium: 3.6 mEq/L (ref 3.5–5.1)

## 2013-01-01 MED ORDER — HYDROCODONE-ACETAMINOPHEN 5-325 MG PO TABS
1.0000 | ORAL_TABLET | Freq: Four times a day (QID) | ORAL | Status: DC | PRN
  Administered 2013-01-01 – 2013-01-03 (×6): 1 via ORAL
  Filled 2013-01-01 (×6): qty 1

## 2013-01-01 NOTE — Progress Notes (Signed)
Physical Therapy Treatment Patient Details Name: Gloria Mcintosh MRN: [id number] DOB: April 29, 1925 Today's Date: 01/01/2013 Time: 4098-1191 PT Time Calculation (min): 25 min  PT Assessment / Plan / Recommendation Comments on Treatment Session  Noted that pt is cleared for PT. Pt limited today by shoulder pain and decreased ability to use arms to assist in funcitional mobility.  She does have good leg stremgth and was able to transfer and walk with walker with min assist.  She will need 24/7 assist and follow up PT at discharge    Follow Up Recommendations  SNF (depends on progress and available assist at home)     Does the patient have the potential to tolerate intense rehabilitation     Barriers to Discharge        Equipment Recommendations  None recommended by PT    Recommendations for Other Services    Frequency Min 3X/week   Plan Discharge plan remains appropriate;Frequency remains appropriate    Precautions / Restrictions Precautions Precautions: Fall Restrictions Weight Bearing Restrictions: No   Pertinent Vitals/Pain Pt c/o pain in  Both shoulders    Mobility  Bed Mobility Bed Mobility: Supine to Sit;Sit to Supine Supine to Sit: 4: Min assist Sitting - Scoot to Edge of Bed: 4: Min assist Sit to Supine: 4: Min assist Details for Bed Mobility Assistance: pt does not use arms to assist with mobiliyt due to shoulder pain Transfers Transfers: Sit to Stand;Stand to Sit Sit to Stand: 4: Min guard Stand to Sit: 4: Min guard Details for Transfer Assistance: Pt does not use arms to push up due to shoulder pain Ambulation/Gait Ambulation/Gait Assistance: 4: Min assist Ambulation Distance (Feet): 75 Feet Assistive device: Rolling walker Ambulation/Gait Assistance Details: pt using RW for balance support,but did not bear weight through arms Pt able to stand erect in RW Gait Pattern: Within Functional Limits Gait velocity: decreased General Gait Details: pt was able  to keep erect posture with eyes forward and step into RW Stairs: No Wheelchair Mobility Wheelchair Mobility: No    Exercises Other Exercises Other Exercises: repeated sit to stand x3 for strengthening   PT Diagnosis:    PT Problem List:   PT Treatment Interventions:     PT Goals Acute Rehab PT Goals PT Goal: Supine/Side to Sit - Progress: Progressing toward goal PT Goal: Sit to Supine/Side - Progress: Progressing toward goal PT Goal: Sit to Stand - Progress: Progressing toward goal PT Goal: Stand to Sit - Progress: Progressing toward goal PT Transfer Goal: Bed to Chair/Chair to Bed - Progress: Progressing toward goal PT Goal: Ambulate - Progress: Progressing toward goal  Visit Information  Last PT Received On: 01/01/13 Assistance Needed: +1    Subjective Data  Subjective: "Its both shoulders." when pt asked what was her biggest problem Patient Stated Goal: "I don't have the slightest idea"   Cognition  Cognition Arousal/Alertness: Awake/alert Behavior During Therapy: WFL for tasks assessed/performed Overall Cognitive Status: Within Functional Limits for tasks assessed Current Attention Level: Alternating General Comments: Question pt abiilty to make decisions about discharge planning She says that she doesn't know if she can go back to her apt or not    Balance  Balance Balance Assessed: Yes Static Sitting Balance Static Sitting - Balance Support: No upper extremity supported Static Sitting - Level of Assistance: 7: Independent Static Standing Balance Static Standing - Balance Support: Bilateral upper extremity supported Static Standing - Level of Assistance: 6: Modified independent (Device/Increase time) Dynamic Standing Balance Dynamic Standing -  Balance Support: No upper extremity supported;During functional activity Dynamic Standing - Level of Assistance:  (Min guard) Dynamic Standing - Balance Activities: Lateral lean/weight shifting;Reaching for objects Dynamic  Standing - Comments: While at sink washing hands and reaching for soap.  While standing and toliet and reaching behind to pull up gown  End of Session PT - End of Session Equipment Utilized During Treatment: Gait belt Activity Tolerance: Patient limited by pain (in shoulders. heat packs placed wit some relief) Patient left: in bed;with call bell/phone within reach;with bed alarm set   GP    Rosey Bath [place], [place] [phone] 01/01/2013, 4:21 PM

## 2013-01-01 NOTE — Progress Notes (Signed)
Impression/Plan  1. Acute on CKD III due to IV contrast, obstruction s/p left ureteral stent on 4/11, ACE-i Infection-; Good urine out put. Will follow for now without current plans for dialysis   2. CKD IIIb- eGFR 35-40 at baseline  3. Klebs UTI from 12/21/12- urine cx here multiple morphotypes, blood cx's negative  Subjective: Interval History: No complaints  Objective: Vital signs in last 24 hours: Temp:  [97.8 F (36.6 C)-98.8 F (37.1 C)] 97.8 F (36.6 C) (04/17 0957) Pulse Rate:  [59-69] 63 (04/17 0957) Resp:  [17-20] 17 (04/17 0957) BP: (153-187)/(55-90) 171/65 mmHg (04/17 0957) SpO2:  [95 %-98 %] 96 % (04/17 0957) Weight:  [63.504 kg (140 lb)] 63.504 kg (140 lb) (04/16 2114) Weight change: 1.304 kg (2 lb 14 oz)  Intake/Output from previous day: 04/16 0701 - 04/17 0700 In: -  Out: 350 [Urine:350] Intake/Output this shift: Total I/O In: 240 [P.O.:240] Out: -   General appearance: alert and cooperative Resp: clear to auscultation bilaterally Chest wall: no tenderness Cardio: regular rate and rhythm, S1, S2 normal, no murmur, click, rub or gallop GI: soft, non-tender; bowel sounds normal; no masses,  no organomegaly Extremities: edema 1  Lab Results:  Recent Labs  12/31/12 0425  WBC 12.7*  HGB 10.6*  HCT 30.7*  PLT 213   BMET:  Recent Labs  12/31/12 0425 01/01/13 0500  NA 138 138  K 3.8 3.6  CL 101 101  CO2 24 24  GLUCOSE 146* 151*  BUN 47* 54*  CREATININE 3.57* 3.49*  CALCIUM 8.8 9.4   No results found for this basename: PTH,  in the last 72 hours Iron Studies: No results found for this basename: IRON, TIBC, TRANSFERRIN, FERRITIN,  in the last 72 hours Studies/Results: No results found.  Scheduled: . amLODipine  5 mg Oral Daily  . docusate sodium  100 mg Oral BID  . insulin aspart  0-9 Units Subcutaneous Q4H  . metoprolol  50 mg Oral BID  . pantoprazole  40 mg Oral QAC supper  . sodium chloride  3 mL Intravenous Q12H  . tamsulosin  0.4 mg  Oral QPC supper     LOS: 6 days   Eyleen Rawlinson C 01/01/2013,12:23 PM

## 2013-01-01 NOTE — Progress Notes (Signed)
Subjective: Patient doing well without complaint, no problems overnight  Objective: Vital signs in last 24 hours: Temp:  [98.2 F (36.8 C)-98.8 F (37.1 C)] 98.3 F (36.8 C) (04/17 0430) Pulse Rate:  [58-69] 59 (04/17 0430) Resp:  [17-20] 18 (04/17 0430) BP: (153-187)/(55-90) 187/90 mmHg (04/17 0430) SpO2:  [95 %-98 %] 98 % (04/17 0430) Weight:  [63.504 kg (140 lb)] 63.504 kg (140 lb) (04/16 2114) Weight change: 1.304 kg (2 lb 14 oz) Last BM Date: 12/30/12  Intake/Output from previous day: 04/16 0701 - 04/17 0700 In: -  Out: 350 [Urine:350] Intake/Output this shift:    General appearance: alert and cooperative Resp: clear to auscultation bilaterally Cardio: regular rate and rhythm, S1, S2 normal, no murmur, click, rub or gallop GI: abdomen soft, no organomegaly Extremities: extremities normal, atraumatic, no cyanosis or edema  Lab Results:  Results for orders placed during the hospital encounter of 12/26/12 (from the past 24 hour(s))  GLUCOSE, CAPILLARY     Status: Abnormal   Collection Time    12/31/12 12:04 PM      Result Value Range   Glucose-Capillary 212 (*) 70 - 99 mg/dL   Comment 1 Documented in Chart     Comment 2 Notify RN    GLUCOSE, CAPILLARY     Status: Abnormal   Collection Time    12/31/12  3:59 PM      Result Value Range   Glucose-Capillary 248 (*) 70 - 99 mg/dL   Comment 1 Documented in Chart     Comment 2 Notify RN    GLUCOSE, CAPILLARY     Status: Abnormal   Collection Time    12/31/12  9:30 PM      Result Value Range   Glucose-Capillary 173 (*) 70 - 99 mg/dL  GLUCOSE, CAPILLARY     Status: Abnormal   Collection Time    01/01/13  1:43 AM      Result Value Range   Glucose-Capillary 155 (*) 70 - 99 mg/dL  GLUCOSE, CAPILLARY     Status: Abnormal   Collection Time    01/01/13  4:34 AM      Result Value Range   Glucose-Capillary 166 (*) 70 - 99 mg/dL  RENAL FUNCTION PANEL     Status: Abnormal   Collection Time    01/01/13  5:00 AM   Result Value Range   Sodium 138  135 - 145 mEq/L   Potassium 3.6  3.5 - 5.1 mEq/L   Chloride 101  96 - 112 mEq/L   CO2 24  19 - 32 mEq/L   Glucose, Bld 151 (*) 70 - 99 mg/dL   BUN 54 (*) 6 - 23 mg/dL   Creatinine, Ser 8.46 (*) 0.50 - 1.10 mg/dL   Calcium 9.4  8.4 - 96.2 mg/dL   Phosphorus 5.5 (*) 2.3 - 4.6 mg/dL   Albumin 2.4 (*) 3.5 - 5.2 g/dL   GFR calc non Af Amer 11 (*) >90 mL/min   GFR calc Af Amer 13 (*) >90 mL/min  GLUCOSE, CAPILLARY     Status: Abnormal   Collection Time    01/01/13  7:31 AM      Result Value Range   Glucose-Capillary 138 (*) 70 - 99 mg/dL      Studies/Results: No results found.  Medications:  Prior to Admission:  Prescriptions prior to admission  Medication Sig Dispense Refill  . ciprofloxacin (CIPRO) 250 MG tablet Take 250 mg by mouth 2 (two) times daily. Started 4.11.14 for  5 days, ending 4.16.14      . colesevelam (WELCHOL) 625 MG tablet Take 1,875 mg by mouth 3 (three) times daily.       Marland Kitchen gabapentin (NEURONTIN) 300 MG capsule Take 300 mg by mouth 3 (three) times daily.      Marland Kitchen glipiZIDE (GLUCOTROL) 10 MG tablet Take 0.5 tablets (5 mg total) by mouth 2 (two) times daily before a meal.  6 tablet  0  . levothyroxine (SYNTHROID, LEVOTHROID) 88 MCG tablet Take 88 mcg by mouth daily before breakfast.      . lisinopril (PRINIVIL,ZESTRIL) 40 MG tablet Take 40 mg by mouth daily.      . metFORMIN (GLUCOPHAGE) 500 MG tablet Take 1,000 mg by mouth 2 (two) times daily with a meal.      . metoprolol (LOPRESSOR) 50 MG tablet Take 25 mg by mouth daily.      Marland Kitchen oxyCODONE-acetaminophen (PERCOCET/ROXICET) 5-325 MG per tablet Take 1 tablet by mouth every 4 (four) hours as needed for pain.      . tamsulosin (FLOMAX) 0.4 MG CAPS Take 1 capsule (0.4 mg total) by mouth daily after supper.  5 capsule  0  . traMADol (ULTRAM) 50 MG tablet Take 50 mg by mouth every 6 (six) hours as needed for pain.       Scheduled: . amLODipine  5 mg Oral Daily  . ciprofloxacin  500 mg  Oral Daily  . docusate sodium  100 mg Oral BID  . insulin aspart  0-9 Units Subcutaneous Q4H  . metoprolol  50 mg Oral BID  . pantoprazole  40 mg Oral QAC supper  . sodium chloride  3 mL Intravenous Q12H  . tamsulosin  0.4 mg Oral QPC supper   Continuous: . sodium chloride 10 mL/hr at 12/29/12 2030    Assessment/Plan: Acute renal failure secondary to contrast dye . Patient has received dialysis times one. , renal function shows slow recovery , discuss with nephrology if patient continues to require inpatient hospitalization while we await renal recovery. Obstructive nephrolithiasis status post stent. Urology input appreciated, stent functioning well,ultrasound shows mild hydronephrosis.  Hypertension , Greater than optimal, meds titrated  UTI Klebsiella pneumonia,cultures since hospitalization have been negative, patient remains afebrile. D/C Cipro.  Chronic right shoulder pain  Diabetes continue sliding scale insulin  Physical therapy note reviewed from yesterday, there was comment about a dialysis catheter, patient does not have catheter, she is cleared to participate in physical therapy Disposition pending nephrology input.  LOS: 6 days   Nikholas Geffre D 01/01/2013, 9:57 AM

## 2013-01-01 NOTE — Progress Notes (Signed)
Occupational Therapy Treatment Patient Details Name: Gloria Mcintosh MRN: [id number] DOB: Aug 23, 1925 Today's Date: 01/01/2013 Time: 7829-5621 OT Time Calculation (min): 38 min  OT Assessment / Plan / Recommendation Comments on Treatment Session Pt was able to maintain balance when walking from recliner>bathroom>sink>recliner.  Pt was able to reach out of BOS while at sink.  Pt still needs A with toliet hygiene can continue focusing on this next session    Follow Up Recommendations  SNF       Equipment Recommendations  None recommended by OT       Frequency Min 2X/week   Plan Discharge plan remains appropriate    Precautions / Restrictions Precautions Precautions: Fall Restrictions Weight Bearing Restrictions: No   Pertinent Vitals/Pain 8/10 Chronic shoulder pain; RN made aware and gave meds during session    ADL  Grooming: Wash/dry hands;Minimal assistance Where Assessed - Grooming: Unsupported standing Toilet Transfer: Min guard;Performed Statistician Method: Sit to Barista: Comfort height toilet Toileting - Clothing Manipulation and Hygiene: Performed;Minimal assistance Where Assessed - Engineer, mining and Hygiene: Standing Equipment Used: Gait belt;Rolling walker Transfers/Ambulation Related to ADLs: Pt needed VCs for hand placement when getting from sit to stand to and from recliner and toliet ADL Comments: Pt able to maintain balance at sink while washing hands and reaching for soap.  Pt needed min A with grasping paper towels.  While in bathroom pt was able to wipe from the front but needed Min A with standing and wiping from the back.      OT Goals ADL Goals ADL Goal: Grooming - Progress: Progressing toward goals ADL Goal: Toilet Transfer - Progress: Progressing toward goals ADL Goal: Toileting - Clothing Manipulation - Progress: Progressing toward goals ADL Goal: Additional Goal #1 - Progress: Met  Visit  Information  Last OT Received On: 01/01/13 Assistance Needed: +1    Subjective Data  Subjective: My shoulders hurt from arthritis(when asked about pain)      Cognition  Cognition Arousal/Alertness: Awake/alert Behavior During Therapy: WFL for tasks assessed/performed Overall Cognitive Status: Within Functional Limits for tasks assessed Current Attention Level: Alternating    Mobility  Bed Mobility Details for Bed Mobility Assistance: In recliner upon arrival Transfers Transfers: Sit to Stand;Stand to Sit Sit to Stand: 4: Min guard Stand to Sit: 4: Min guard Details for Transfer Assistance: Pt needed cueing on hand placement from and to recliner and toliet       Balance Balance Balance Assessed: Yes Dynamic Standing Balance Dynamic Standing - Balance Support: No upper extremity supported;During functional activity Dynamic Standing - Level of Assistance:  (Min guard) Dynamic Standing - Balance Activities: Lateral lean/weight shifting;Reaching for objects Dynamic Standing - Comments: While at sink washing hands and reaching for soap.  While standing and toliet and reaching behind to pull up gown   End of Session OT - End of Session Equipment Utilized During Treatment: Gait belt Activity Tolerance: Patient tolerated treatment well Patient left: in chair;with family/visitor present;with call bell/phone within reach       Dietrich Pates 01/01/2013, 2:55 PM

## 2013-01-01 NOTE — Progress Notes (Signed)
I have reviewed and agree with this note.  Cathy Shalaya Swailes, OTR/L [phone] 01/01/2013   

## 2013-01-01 NOTE — Progress Notes (Cosign Needed)
Clinical social work intern left message at [facility] to update about pt. CSW intern informed facility of pt moving to [facility]. CSW to follow up as needed.  Morton Stall BSW intern

## 2013-01-02 LAB — RENAL FUNCTION PANEL
BUN: 57 mg/dL — ABNORMAL HIGH (ref 6–23)
CO2: 23 mEq/L (ref 19–32)
Calcium: 9.3 mg/dL (ref 8.4–10.5)
Creatinine, Ser: 3.51 mg/dL — ABNORMAL HIGH (ref 0.50–1.10)
GFR calc Af Amer: 13 mL/min — ABNORMAL LOW (ref >90)
Glucose, Bld: 158 mg/dL — ABNORMAL HIGH (ref 70–99)
Phosphorus: 5.7 mg/dL — ABNORMAL HIGH (ref 2.3–4.6)
Sodium: 138 mEq/L (ref 135–145)

## 2013-01-02 LAB — CULTURE, BLOOD (ROUTINE X 2): Culture: NO GROWTH

## 2013-01-02 LAB — GLUCOSE, CAPILLARY
Glucose-Capillary: 120 mg/dL — ABNORMAL HIGH (ref 70–99)
Glucose-Capillary: 161 mg/dL — ABNORMAL HIGH (ref 70–99)
Glucose-Capillary: 135 mg/dL — ABNORMAL HIGH (ref 70–99)
Glucose-Capillary: 237 mg/dL — ABNORMAL HIGH (ref 70–99)

## 2013-01-02 NOTE — Progress Notes (Signed)
Impression/Plan  1. Acute on CKD III due to IV contrast, obstruction s/p left ureteral stent on 4/11, ACE-i Infection-; Good urine out put. Will follow for now without current plans for dialysis: cont conservative management   2. CKD IIIb- eGFR 35-40 (cr 1.3)at baseline  3. Klebs UTI from 12/21/12- urine cx here multiple morphotypes, blood cx's negative   Subjective: Interval History: About the same  Objective: Vital signs in last 24 hours: Temp:  [97 F (36.1 C)-98.5 F (36.9 C)] 98.1 F (36.7 C) (04/18 0900) Pulse Rate:  [59-64] 64 (04/18 0900) Resp:  [17-18] 17 (04/18 0900) BP: (112-165)/(51-75) 112/71 mmHg (04/18 0900) SpO2:  [95 %-97 %] 97 % (04/18 0900) Weight change:   Intake/Output from previous day: 04/17 0701 - 04/18 0700 In: 1487.8 [P.O.:600; I.V.:887.8] Out: -  Intake/Output this shift: Total I/O In: 120 [P.O.:120] Out: -   General appearance: alert and cooperative Resp: clear to auscultation bilaterally Cardio: regular rate and rhythm, S1, S2 normal, no murmur, click, rub or gallop Extremities: extremities normal, atraumatic, no cyanosis or edema  Lab Results:  Recent Labs  12/31/12 0425  WBC 12.7*  HGB 10.6*  HCT 30.7*  PLT 213   BMET:  Recent Labs  01/01/13 0500 01/02/13 0638  NA 138 138  K 3.6 3.7  CL 101 101  CO2 24 23  GLUCOSE 151* 158*  BUN 54* 57*  CREATININE 3.49* 3.51*  CALCIUM 9.4 9.3   No results found for this basename: PTH,  in the last 72 hours Iron Studies: No results found for this basename: IRON, TIBC, TRANSFERRIN, FERRITIN,  in the last 72 hours Studies/Results: No results found.  Scheduled: . amLODipine  5 mg Oral Daily  . docusate sodium  100 mg Oral BID  . insulin aspart  0-9 Units Subcutaneous Q4H  . metoprolol  50 mg Oral BID  . pantoprazole  40 mg Oral QAC supper  . sodium chloride  3 mL Intravenous Q12H  . tamsulosin  0.4 mg Oral QPC supper    LOS: 7 days   Gloria Mcintosh C 01/02/2013,11:09 AM

## 2013-01-02 NOTE — Clinical Social Work Note (Signed)
CSW talked with admissions director Candice [phone]) at [facility] skilled facility regarding patient discharging to them on Saturday, 4/19. Per Candice, a Saturday discharge is fine and the MD can prepare d/c paperwork Friday or first thing Saturday morning (for CSW submission to facility). Candice will be at facility on Saturday and can accept paperwork through [facility] system.  Genelle Bal, MSW, LCSW [phone]

## 2013-01-02 NOTE — Progress Notes (Signed)
Subjective: Tired  Objective: Vital signs in last 24 hours: Temp:  [97 F (36.1 C)-98.5 F (36.9 C)] 98.1 F (36.7 C) (04/18 0900) Pulse Rate:  [59-64] 64 (04/18 0900) Resp:  [17-18] 17 (04/18 0900) BP: (112-165)/(51-75) 112/71 mmHg (04/18 0900) SpO2:  [95 %-97 %] 97 % (04/18 0900) Weight change:  Last BM Date: 12/31/12  Intake/Output from previous day: 04/17 0701 - 04/18 0700 In: 1487.8 [P.O.:600; I.V.:887.8] Out: -  Intake/Output this shift: Total I/O In: 120 [P.O.:120] Out: -   General appearance: alert and cooperative Resp: clear to auscultation bilaterally Cardio: regular rate and rhythm, S1, S2 normal, no murmur, click, rub or gallop GI: soft, non-tender; bowel sounds normal; no masses,  no organomegaly Extremities: extremities normal, atraumatic, no cyanosis or edema  Lab Results:  Recent Labs  12/31/12 0425  WBC 12.7*  HGB 10.6*  HCT 30.7*  PLT 213   BMET  Recent Labs  01/01/13 0500 01/02/13 0638  NA 138 138  K 3.6 3.7  CL 101 101  CO2 24 23  GLUCOSE 151* 158*  BUN 54* 57*  CREATININE 3.49* 3.51*  CALCIUM 9.4 9.3    Studies/Results: No results found.  Medications: I have reviewed the patient's current medications.  Assessment/Plan: Acute renal failure secondary to contrast dye . Patient has received dialysis times one. , renal function shows slow recovery , no current plans for further dialysis, NHP for rehab  Obstructive nephrolithiasis status post stent. Urology input appreciated, stent functioning well,ultrasound shows mild hydronephrosis.  Hypertension , Greater than optimal, meds titrated  UTI Klebsiella pneumonia,cultures since hospitalization have been negative, patient remains afebrile. Cipro d/ced  Diabetes continue sliding scale insulin controlled Disposition ST rehab hopefully at [facility] with eventual return to independent living with weekly check of renal function.  Possible to discharge tomorrow or Monday if necessary   LOS: 7 days   Gloria Mcintosh JOSEPH 01/02/2013, 10:41 AM

## 2013-01-02 NOTE — Progress Notes (Signed)
Inpatient Diabetes Program Recommendations  AACE/ADA: New Consensus Statement on Inpatient Glycemic Control (2013)  Target Ranges:  Prepandial:   less than 140 mg/dL      Peak postprandial:   less than 180 mg/dL (1-2 hours)      Critically ill patients:  140 - 180 mg/dL   Results for Gloria Mcintosh, Gloria Mcintosh (MRN [id number]) as of 01/02/2013 12:40  Ref. Range 01/01/2013 01:43 01/01/2013 04:34 01/01/2013 07:31 01/01/2013 11:13 01/01/2013 16:48 01/01/2013 21:04 01/02/2013 01:16 01/02/2013 04:14 01/02/2013 07:57 01/02/2013 12:11  Glucose-Capillary Latest Range: 70-99 mg/dL 657 (H) 846 (H) 962 (H) 209 (H) 253 (H) 267 (H) 120 (H) 161 (H) 135 (H) 200 (H)    Inpatient Diabetes Program Recommendations Correction (SSI): If patient is eating well, please considier changing the frequency from Q4H to ACHS and increase Novolog correction scale to moderate.  Note: Patient has a history of diabetes and takes Glucotrol 5mg  BID and Metfromin 1000 mg BID at home for diabetes management.  Currently, patient is ordered to receive Novolog 0-9 units Q4H for inpatient glycemic control.  Blood glucose over the past 30 hours has ranged from 120-267 mg/dl.  If patient is eating well, please consider changing the frequency from Q4H to ACHS and increase Novolog correction to moderate scale.  Will continue to follow.  Thanks, Orlando Penner, RN, BSN, CCRN Diabetes Coordinator Inpatient Diabetes Program [phone]

## 2013-01-02 NOTE — Progress Notes (Signed)
Physical Therapy Treatment Patient Details Name: Gloria Mcintosh MRN: [id number] DOB: 12/02/1924 Today's Date: 01/02/2013 Time: 1324-4010 PT Time Calculation (min): 13 min  PT Assessment / Plan / Recommendation Comments on Treatment Session  Patient continues to progress with mobility.  Shoulder pain limits mobility.    Follow Up Recommendations  SNF (depends on availability of 24 hour assist at home)     Does the patient have the potential to tolerate intense rehabilitation     Barriers to Discharge        Equipment Recommendations  None recommended by PT    Recommendations for Other Services    Frequency Min 3X/week   Plan Discharge plan remains appropriate;Frequency remains appropriate    Precautions / Restrictions Precautions Precautions: Fall Restrictions Weight Bearing Restrictions: No   Pertinent Vitals/Pain Shoulder pain limiting mobility    Mobility  Bed Mobility Bed Mobility: Not assessed Transfers Transfers: Sit to Stand;Stand to Sit Sit to Stand: 4: Min guard;From chair/3-in-1;Without upper extremity assist Stand to Sit: 4: Min guard;Without upper extremity assist;To chair/3-in-1 Details for Transfer Assistance: No verbal cues needed.  Patient does not use UE's to assist with transfers due to shoulder pain.  Able to stand with min guard assist only for safety. Ambulation/Gait Ambulation/Gait Assistance: 4: Min guard Ambulation Distance (Feet): 120 Feet Assistive device: Rolling walker Ambulation/Gait Assistance Details: Verbal cues to stay close to RW.  UE's used on RW for balance only.  Assist for safety/balance. Gait Pattern: Step-through pattern;Decreased step length - right;Decreased step length - left Gait velocity: decreased      PT Goals Acute Rehab PT Goals PT Goal: Sit to Stand - Progress: Progressing toward goal PT Goal: Stand to Sit - Progress: Progressing toward goal PT Transfer Goal: Bed to Chair/Chair to Bed - Progress:  Progressing toward goal PT Goal: Ambulate - Progress: Progressing toward goal  Visit Information  Last PT Received On: 01/02/13 Assistance Needed: +1    Subjective Data  Subjective: Pt c/o bil. shoulder pain following session.   Cognition  Cognition Arousal/Alertness: Awake/alert Behavior During Therapy: WFL for tasks assessed/performed Overall Cognitive Status: Within Functional Limits for tasks assessed    Balance     End of Session PT - End of Session Equipment Utilized During Treatment: Gait belt Activity Tolerance: Patient limited by pain (shoulders) Patient left: in chair;with call bell/phone within reach;with chair alarm set Nurse Communication: Mobility status   GP     Vena Austria 01/02/2013, 5:35 PM Durenda Hurt. Renaldo Fiddler, [facility] Acute Rehab Services Pager [phone]

## 2013-01-03 LAB — CBC
MCV: 92.3 fL (ref 78.0–100.0)
Platelets: 286 10*3/uL (ref 150–400)
RBC: 3.49 MIL/uL — ABNORMAL LOW (ref 3.87–5.11)
WBC: 14.1 10*3/uL — ABNORMAL HIGH (ref 4.0–10.5)

## 2013-01-03 LAB — RENAL FUNCTION PANEL
Albumin: 2.5 g/dL — ABNORMAL LOW (ref 3.5–5.2)
BUN: 54 mg/dL — ABNORMAL HIGH (ref 6–23)
Chloride: 102 mEq/L (ref 96–112)
GFR calc Af Amer: 15 mL/min — ABNORMAL LOW (ref >90)
Glucose, Bld: 138 mg/dL — ABNORMAL HIGH (ref 70–99)
Potassium: 3.7 mEq/L (ref 3.5–5.1)

## 2013-01-03 LAB — GLUCOSE, CAPILLARY
Glucose-Capillary: 136 mg/dL — ABNORMAL HIGH (ref 70–99)
Glucose-Capillary: 156 mg/dL — ABNORMAL HIGH (ref 70–99)

## 2013-01-03 MED ORDER — COLESEVELAM HCL 625 MG PO TABS: 1875.0000 mg | ORAL_TABLET | Freq: Two times a day (BID) | ORAL | Status: AC

## 2013-01-03 MED ORDER — AMLODIPINE BESYLATE 5 MG PO TABS: 5.0000 mg | ORAL_TABLET | Freq: Every day | ORAL | Status: AC

## 2013-01-03 NOTE — Progress Notes (Addendum)
Patient d/c'd to [facility] for rehab. Telemetry and IV d/c'd. Patient d/c'd with a pressure ulcer to sacral area as noted in shift assessment. Misty Stanley, at [facility] given report and notified of ulcer and cream applied. Family notified of d/c, son Lyda Jester.

## 2013-01-03 NOTE — Progress Notes (Signed)
Impression/Plan  Acute on CKD III due to IV contrast, obstruction s/p left ureteral stent on 4/11, ACE-i Infection-; Recovery phase of AKI   Will be happy to see if needed as out patient.  Subjective: Interval History: Feels ok except arthritis in shoulders  Objective: Vital signs in last 24 hours: Temp:  [98 F (36.7 C)-98.7 F (37.1 C)] 98.6 F (37 C) (04/19 0943) Pulse Rate:  [56-71] 67 (04/19 0943) Resp:  [17-18] 17 (04/19 0943) BP: (120-177)/(69-80) 169/74 mmHg (04/19 0943) SpO2:  [95 %-97 %] 97 % (04/19 0943) Weight:  [61.2 kg (134 lb 14.7 oz)] 61.2 kg (134 lb 14.7 oz) (04/18 2200) Weight change:   Intake/Output from previous day: 04/18 0701 - 04/19 0700 In: 900 [P.O.:440; I.V.:460] Out: 3 [Urine:1; Stool:2] Intake/Output this shift: Total I/O In: 200 [P.O.:200] Out: -   General appearance: alert and cooperative Resp: clear to auscultation bilaterally Chest wall: no tenderness Cardio: regular rate and rhythm, S1, S2 normal, no murmur, click, rub or gallop GI: soft, non-tender; bowel sounds normal; no masses,  no organomegaly Extremities: extremities normal, atraumatic, no cyanosis or edema  Lab Results:  Recent Labs  01/03/13 0445  WBC 14.1*  HGB 10.8*  HCT 32.2*  PLT 286   BMET:  Recent Labs  01/02/13 0638 01/03/13 0445  NA 138 140  K 3.7 3.7  CL 101 102  CO2 23 24  GLUCOSE 158* 138*  BUN 57* 54*  CREATININE 3.51* 3.03*  CALCIUM 9.3 9.4   No results found for this basename: PTH,  in the last 72 hours Iron Studies: No results found for this basename: IRON, TIBC, TRANSFERRIN, FERRITIN,  in the last 72 hours Studies/Results: No results found.  Scheduled: . amLODipine  5 mg Oral Daily  . docusate sodium  100 mg Oral BID  . insulin aspart  0-9 Units Subcutaneous Q4H  . metoprolol  50 mg Oral BID  . pantoprazole  40 mg Oral QAC supper  . sodium chloride  3 mL Intravenous Q12H  . tamsulosin  0.4 mg Oral QPC supper     LOS: 8 days    Meliana Canner C 01/03/2013,12:00 PM

## 2013-01-03 NOTE — Clinical Social Work Note (Signed)
Patient to go to [facility] today via [facility]. Patient and facility aware of transfer. Discharge packet complete. CSW signing off.  Ricke Hey, [place] [phone] (weekend)

## 2013-01-03 NOTE — Discharge Summary (Signed)
Physician Discharge Summary  Patient ID: Gloria Mcintosh MRN: [id number] DOB/AGE: September 15, 1925 79 y.o.  Admit date: 12/26/2012 Discharge date: 01/03/2013  Admission Diagnoses: Acute renal failure Hypertension Diabetes mellitus Obstructive uropathy Nephrolithiasis  Hyponatremia UTI   Discharge Diagnoses:  Active Problems:   Acute renal failure   Hypertension   Diabetes mellitus   Obstructive uropathy  Nephrolithiasis   Hyponatremia   UTI (urinary tract infection)   Discharged Condition: good  Hospital Course: The patient is an 79 year old female history of tremors hypertension, diabetes, coronary artery disease and gout who presented with one-week history of back pain, had been seen in emergency department and diagnosed with kidney stone and prescribed Percocet. She went home and then developed confusion. She was informed she had a UTI by her urologist and started on Keflex. Despite this, continued to have severe confusion, brought to the primary care physician and found to have acute renal failure, CT scan of the abdomen showed left hydronephrosis and creatinine was 4.69 with a sodium of 119. Hydrochlorothiazide was stopped. She was given IV normal saline. She was seen by the nephrology service. The impression was acute on chronic kidney disease stage III due to IV contrast, obstruction of left ureteral outflow. She had a Klebsiella UTI from April 6 and was given and completed a course of antibiotics. The patient had a left double-J ureteral stent placed on April 12 by Dr. Retta Diones. She was eventually treated with Lasix as well as IV fluids with good urine output. Her creatinine began to gradually drop and at discharge BUN was 54 creatinine 3.03 with good urine output. The patient was generally weak and physical therapy recommended short-term rehabilitation and nursing home. The patient sodium returned to normal. Nephrology felt the patient could be discharged with weekly the  termination of renal function while at the nursing home. As for the patient's hypertension lisinopril was stopped and she started on amlodipine was reasonable blood pressure at discharge. She also remained on a beta blocker. As for her diabetes metformin and glipizide were both stopped. Need to stop metformin until renal function improves more. She was treated with a sliding scale and at discharge her blood sugars are running in the mid 100s. We did not restart glipizide but if blood sugars rise as an outpatient she could be started on glipizide 5 mg a day. The patient's thyroid was restarted at discharge. She had been on gabapentin in the past but this was held and discontinued   Consults: nephrology and urology  Significant Diagnostic Studies: labs: At discharge sodium 140, potassium 2.7, chloride 102, bicarbonate 24, glucose 138, BUN 24, creatinine 3.03, CBC WBC 14.1, hemoglobin 10.8, platelet 286 and radiology: CT brain unremarkable, CT abdomen pelvis 4 mm left ureteral calculus in the left ureteral vesicular junction a moderate left hydronephrosis and perinephric stranding  Treatments: IV hydration, antibiotics: Cipro, analgesia: Oxycodone and procedures: Left ureteral stent  Discharge Exam: Blood pressure 169/74, pulse 67, temperature 98.6 F (37 C), temperature source Oral, resp. rate 17, height 5\' 2"  (1.575 m), weight 61.2 kg (134 lb 14.7 oz), SpO2 97.00%. Resp: clear to auscultation bilaterally Cardio: regular rate and rhythm, S1, S2 normal, no murmur, click, rub or gallop  Disposition: Nursing home who      Medication List    STOP taking these medications       ciprofloxacin 250 MG tablet  Commonly known as:  CIPRO     gabapentin 300 MG capsule  Commonly known as:  NEURONTIN  glipiZIDE 10 MG tablet  Commonly known as:  GLUCOTROL     lisinopril 40 MG tablet  Commonly known as:  PRINIVIL,ZESTRIL     metFORMIN 500 MG tablet  Commonly known as:  GLUCOPHAGE     traMADol  50 MG tablet  Commonly known as:  ULTRAM      TAKE these medications       amLODipine 5 MG tablet  Commonly known as:  NORVASC  Take 1 tablet (5 mg total) by mouth daily.     colesevelam 625 MG tablet  Commonly known as:  WELCHOL  Take 3 tablets (1,875 mg total) by mouth 2 (two) times daily with a meal.     levothyroxine 88 MCG tablet  Commonly known as:  SYNTHROID, LEVOTHROID  Take 88 mcg by mouth daily before breakfast.     metoprolol 50 MG tablet  Commonly known as:  LOPRESSOR  Take 25 mg by mouth daily.     oxyCODONE-acetaminophen 5-325 MG per tablet  Commonly known as:  PERCOCET/ROXICET  Take 1 tablet by mouth every 4 (four) hours as needed for pain.     tamsulosin 0.4 MG Caps  Commonly known as:  FLOMAX  Take 1 capsule (0.4 mg total) by mouth daily after supper.           Follow-up Information   Follow up with DAHLSTEDT, Bertram Millard, MD In 4 weeks. (call to schedule appointment after you go home)    Contact information:   [address] [place] [address] [phone]       Follow up with Katy Apo, MD In 4 weeks.   Contact information:   [address] [place] [address] [phone]       Signed: Lillia Mountain 01/03/2013, 11:19 AM

## 2014-03-06 IMAGING — CT CT ABD-PELV W/O CM
2 of 4 series · 17 of 46 positions shown, 19 images · non-contrast
Comparison: 12/21/2012

CLINICAL DATA: Urinary tract infection.  Urolithiasis.Renal
insufficiency.

CT ABDOMEN AND PELVIS WITHOUT CONTRAST
TECHNIQUE: Multidetector CT imaging of the abdomen and pelvis was
performed following the standard protocol without intravenous
contrast.

[Series 2: stone >200 lbs 5.0 b31f st · axial · 0.77mm/px · z∈[-404,+0]mm · 14 of 91 slices shown, 16 images]
[im 5/91  soft-tissue]
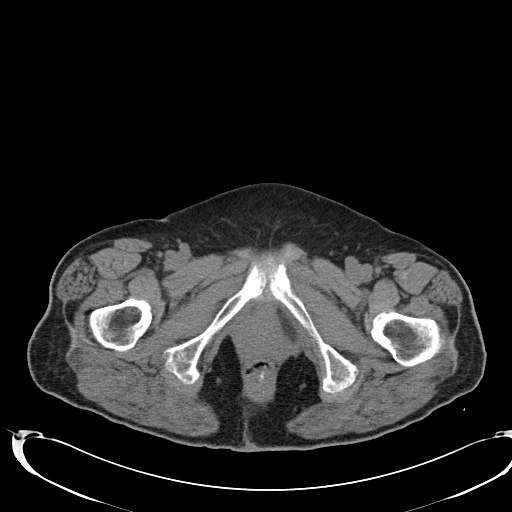
[im 5/91  bone]
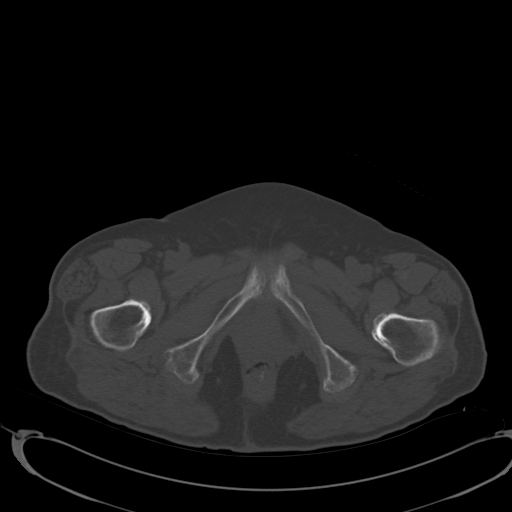
[im 13/91  soft-tissue]
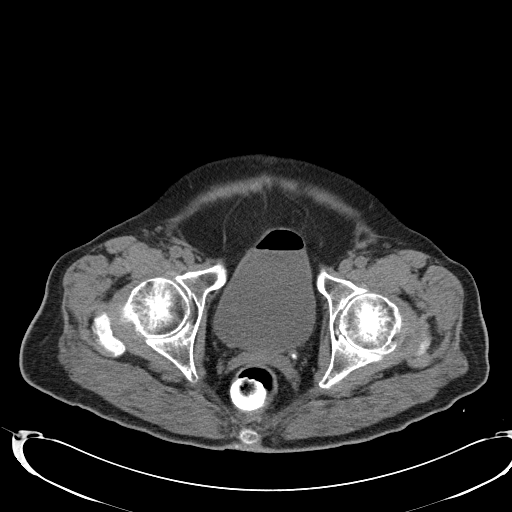
[im 17/91  soft-tissue]
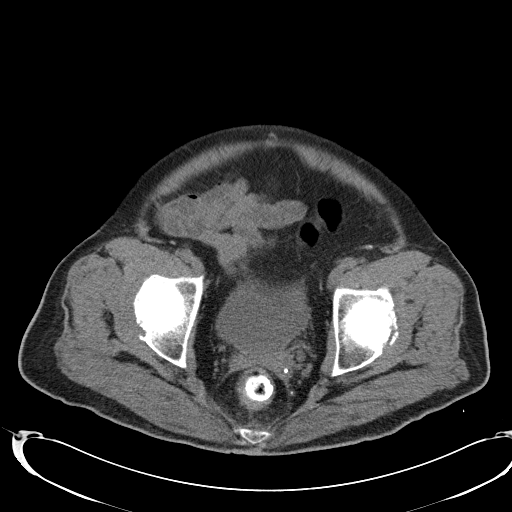
[im 25/91  soft-tissue]
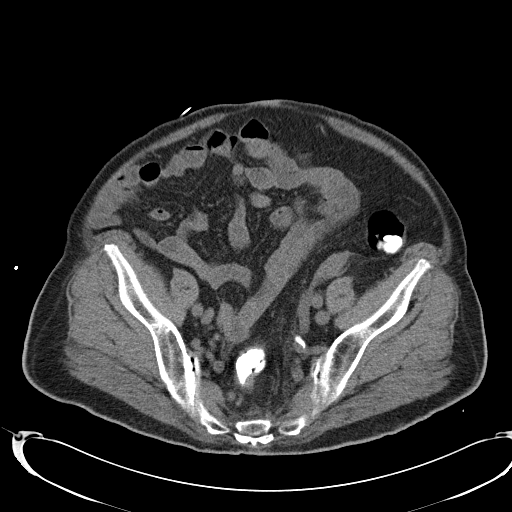
[im 29/91  soft-tissue]
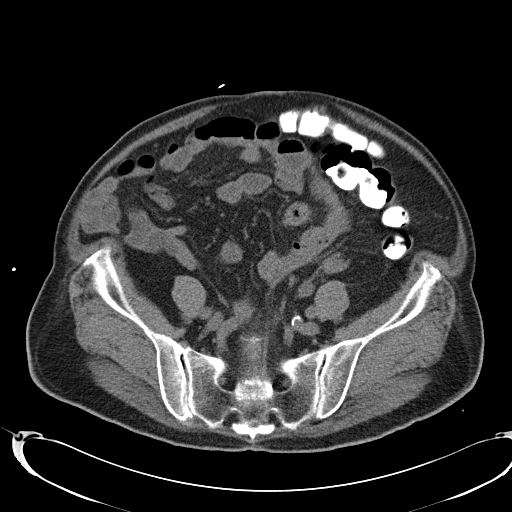
[im 37/91  soft-tissue]
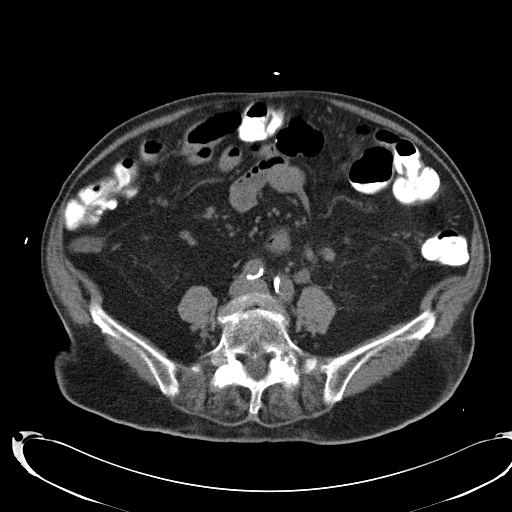
[im 41/91  soft-tissue]
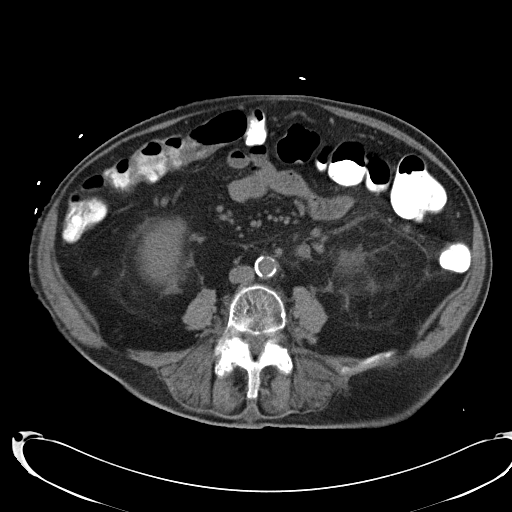
[im 50/91  soft-tissue]
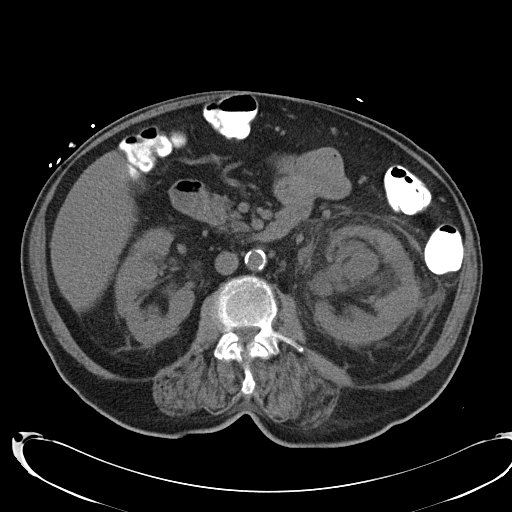
[im 54/91  soft-tissue]
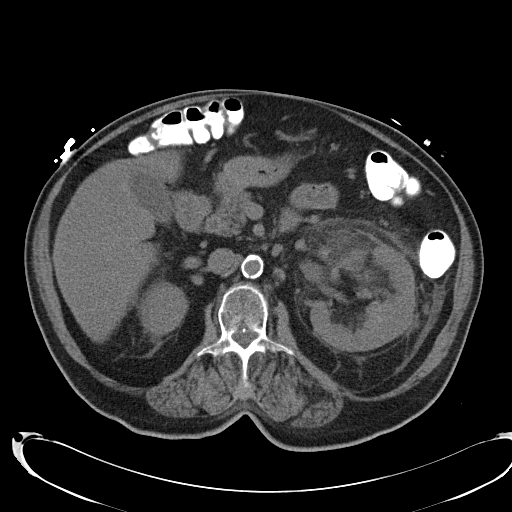
[im 54/91  bone]
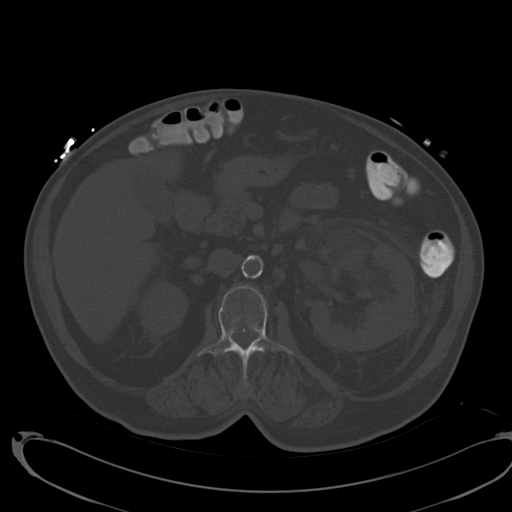
[im 62/91  soft-tissue]
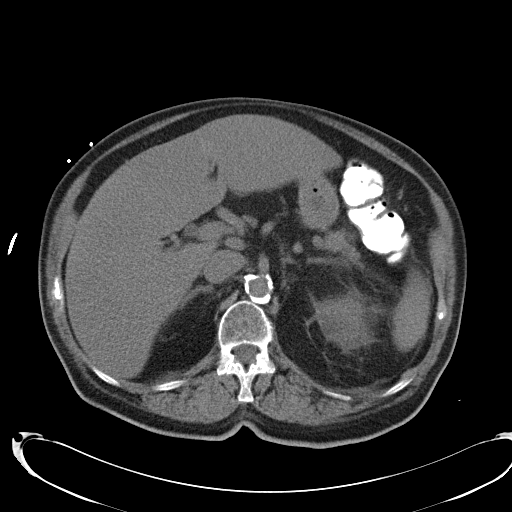
[im 66/91  soft-tissue]
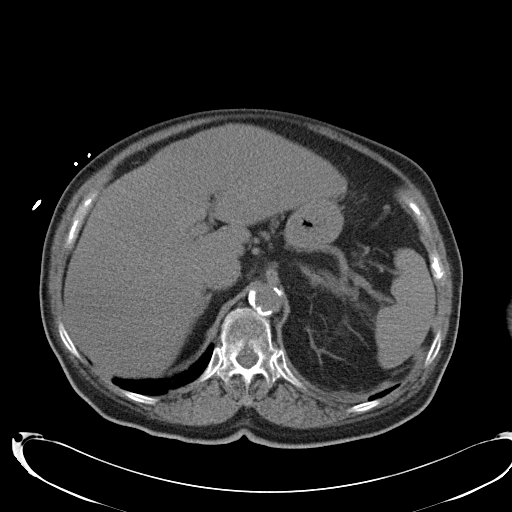
[im 74/91  soft-tissue]
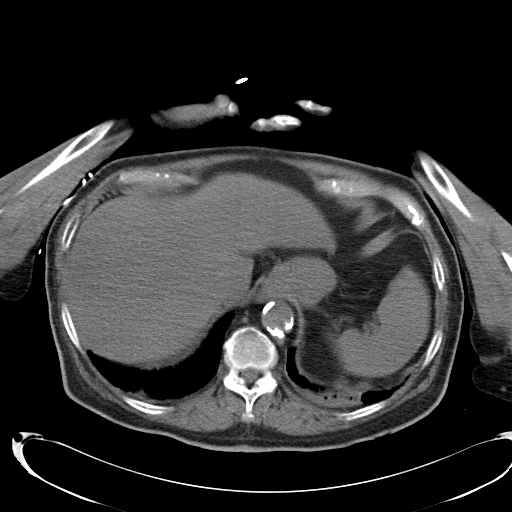
[im 78/91  soft-tissue]
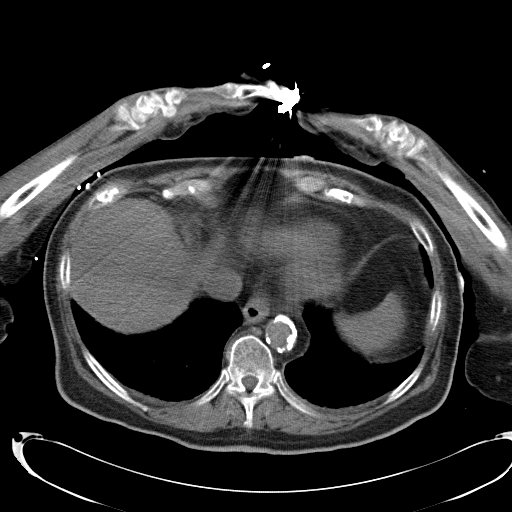
[im 86/91  soft-tissue]
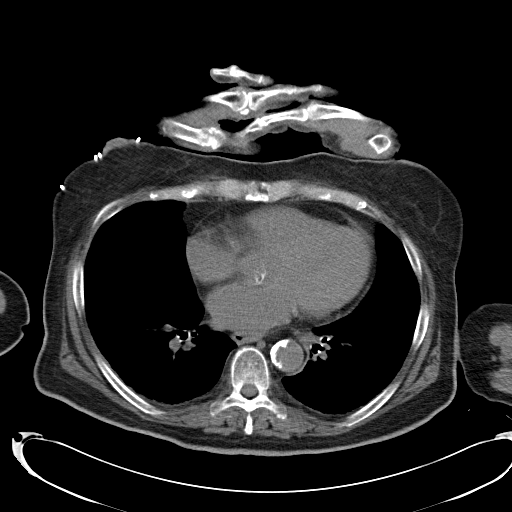

[Series 602: <mpr thick coronals · coronal · 0.89mm/px · 3 of 82 slices shown]
[im 28/82  soft-tissue]
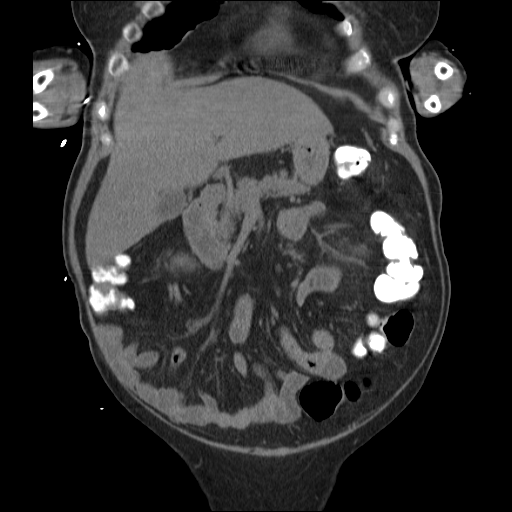
[im 37/82  soft-tissue]
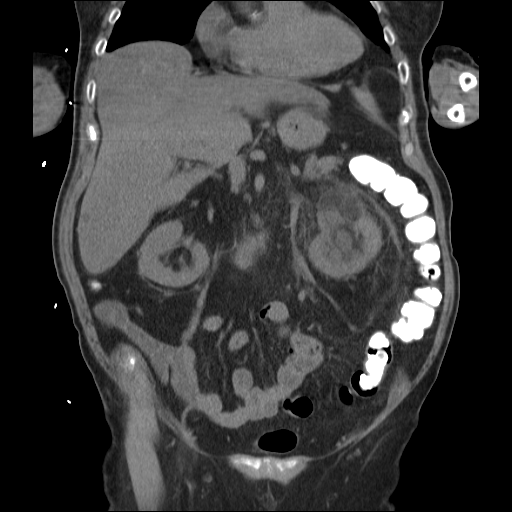
[im 46/82  soft-tissue]
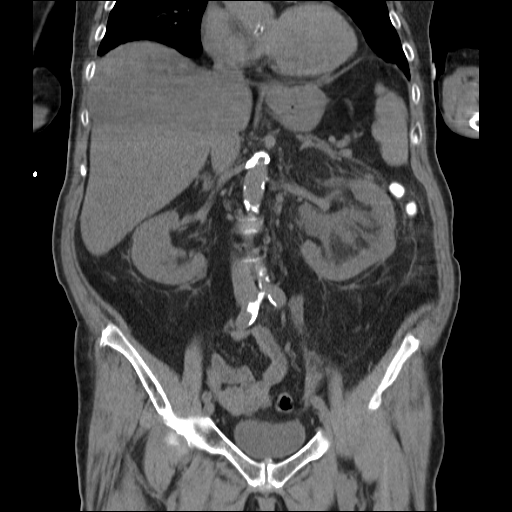

[17 of 46 positions shown; findings below may reference images not displayed]

FINDINGS: Mild increase in left-sided hydronephrosis and
perinephric stranding are seen.  Increased dilatation of left
ureter is seen and the previously noted calculus near the left
ureteral pelvic junction has migrated distally to the
ureterovesicle junction.  This measures approximately 4 mm.  An air-
fluid level is seen in the urinary bladder likely from recent
bladder catheterization.  No evidence of right-sided ureteral
calculus or hydronephrosis.  Prior hysterectomy noted.

The other abdominal parenchymal organs show no significant
abnormality.  Small low attenuation lesions in the right hepatic
lobe are stable likely representing tiny benign cysts or
hemangiomas.  Gallbladder is unremarkable.  No soft tissue masses
or lymphadenopathy identified.  No evidence of dilated bowel loops.
IMPRESSION: 4 mm left ureteral calculus shows distal migration, now 1 year the
left ureterovesicle junction.  Mild increase in moderate left
hydronephrosis and perinephric stranding.

## 2016-09-02 ENCOUNTER — Emergency Department (HOSPITAL_COMMUNITY)
Admission: EM | Admit: 2016-09-02 | Discharge: 2016-09-02 | Disposition: A | Discharge status: Home / Self Care | Payer: Medicare Other | Attending: Emergency Medicine | Admitting: Emergency Medicine

## 2016-09-02 ENCOUNTER — Encounter (HOSPITAL_COMMUNITY): Payer: Self-pay | Admitting: *Deleted

## 2016-09-02 ENCOUNTER — Emergency Department (HOSPITAL_COMMUNITY): Payer: Medicare Other

## 2016-09-02 DIAGNOSIS — I10 Essential (primary) hypertension: Secondary | ICD-10-CM | POA: Insufficient documentation

## 2016-09-02 DIAGNOSIS — Y939 Activity, unspecified: Secondary | ICD-10-CM | POA: Diagnosis not present

## 2016-09-02 DIAGNOSIS — X58XXXA Exposure to other specified factors, initial encounter: Secondary | ICD-10-CM | POA: Diagnosis not present

## 2016-09-02 DIAGNOSIS — E119 Type 2 diabetes mellitus without complications: Secondary | ICD-10-CM | POA: Diagnosis not present

## 2016-09-02 DIAGNOSIS — R52 Pain, unspecified: Secondary | ICD-10-CM

## 2016-09-02 DIAGNOSIS — J029 Acute pharyngitis, unspecified: Secondary | ICD-10-CM | POA: Insufficient documentation

## 2016-09-02 DIAGNOSIS — S00502A Unspecified superficial injury of oral cavity, initial encounter: Secondary | ICD-10-CM | POA: Diagnosis present

## 2016-09-02 DIAGNOSIS — Y999 Unspecified external cause status: Secondary | ICD-10-CM | POA: Insufficient documentation

## 2016-09-02 DIAGNOSIS — Z955 Presence of coronary angioplasty implant and graft: Secondary | ICD-10-CM | POA: Diagnosis not present

## 2016-09-02 DIAGNOSIS — Y929 Unspecified place or not applicable: Secondary | ICD-10-CM | POA: Diagnosis not present

## 2016-09-02 DIAGNOSIS — S00512A Abrasion of oral cavity, initial encounter: Secondary | ICD-10-CM | POA: Diagnosis not present

## 2016-09-02 DIAGNOSIS — I2510 Atherosclerotic heart disease of native coronary artery without angina pectoris: Secondary | ICD-10-CM | POA: Diagnosis not present

## 2016-09-02 MED ORDER — LIDOCAINE VISCOUS 2 % MT SOLN
15.0000 mL | Freq: Once | OROMUCOSAL | Status: AC
  Administered 2016-09-02: 15 mL via OROMUCOSAL
  Filled 2016-09-02: qty 15

## 2016-09-02 NOTE — ED Provider Notes (Signed)
[facility]-EMERGENCY DEPT Provider Note   CSN: [id number] Arrival date & time: 09/02/16  1933     History   Chief Complaint Chief Complaint  Patient presents with  . Sore Throat    HPI Gloria Mcintosh is a 82 y.o. female.  HPI   Patient is an 82 year old female who presents today with sore throat that started this morning. States onset was at the time of taking her medications. Throughout the day the patient has not wanted to eat any food. Her daughter has been with her and has gotten her to drink fluids. Of note, she has recently lost her dental partial. Her daughter thinks she may misplaced it somewhere at the assisted living patient denies accidentally swallowing this. She's not had any drooling. No respiratory issues. She denies any chest pain, abdominal pain. Has not had any hematuria or dysuria. No diarrhea. Her facility has prescribed Magic mouthwash for her but they will not be to get this filled until tomorrow.   Past Medical History:  Diagnosis Date  . Arthritis    right shoulder  . Coronary artery disease   . Diabetes mellitus   . Gout 2014  . High cholesterol   . Hypertension   . Tremors of nervous system     Patient Active Problem List   Diagnosis Date Noted  . Hydronephrosis 12/27/2012  . Kidney stone 12/27/2012  . Hyponatremia 12/27/2012  . UTI (urinary tract infection) 12/27/2012  . Acute renal failure (HCC) 12/26/2012  . Hypertension 12/26/2012  . Diabetes mellitus (HCC) 12/26/2012    Past Surgical History:  Procedure Laterality Date  . ABDOMINAL HYSTERECTOMY    . CARDIAC CATHETERIZATION  1999   stent to LAD  . CYSTOSCOPY WITH STENT PLACEMENT Left 12/27/2012   Procedure: CYSTOSCOPY WITH Left JJ STENT PLACEMENT;  Surgeon: Marcine MatarStephen Dahlstedt, MD;  [facility];  Service: Urology;  Laterality: Left;  . HAND SURGERY     trigger finger and carpal tunnel    OB History    No data available       Home Medications    Prior to Admission  medications   Medication Sig Start Date End Date Taking? Authorizing Provider  amLODipine (NORVASC) 5 MG tablet Take 1 tablet (5 mg total) by mouth daily. 01/03/13   Kirby FunkJohn Griffin, MD  colesevelam [facility](WELCHOL) 625 MG tablet Take 3 tablets (1,875 mg total) by mouth 2 (two) times daily with a meal. 01/03/13   Kirby FunkJohn Griffin, MD  levothyroxine (SYNTHROID, LEVOTHROID) 88 MCG tablet Take 88 mcg by mouth daily before breakfast.    Historical Provider, MD  metoprolol (LOPRESSOR) 50 MG tablet Take 25 mg by mouth daily.    Historical Provider, MD  oxyCODONE-acetaminophen (PERCOCET/ROXICET) 5-325 MG per tablet Take 1 tablet by mouth every 4 (four) hours as needed for pain.    Historical Provider, MD  tamsulosin (FLOMAX) 0.4 MG CAPS Take 1 capsule (0.4 mg total) by mouth daily after supper. 12/21/12   Gwyneth SproutWhitney Plunkett, MD    Family History Family History  Problem Relation Age of Onset  . Rheum arthritis Mother   . Lymphoma Mother   . Hypertension Mother   . Diabetes type II Father   . Stroke Sister   . Diabetes type II Brother   . Hypertension Daughter   . Hypertension Son   . Diabetes type II Son     Social History Social History  Substance Use Topics  . Smoking status: Never Smoker  . Smokeless tobacco: Never Used  .  Alcohol use No     Allergies   Inderal [propranolol hcl]   Review of Systems Review of Systems  Constitutional: Negative for chills and fatigue.  HENT: Positive for sore throat. Negative for congestion and drooling.   Respiratory: Negative for shortness of breath and wheezing.   Gastrointestinal: Negative for nausea and vomiting.  Psychiatric/Behavioral: Negative for agitation.  All other systems reviewed and are negative.    Physical Exam Updated Vital Signs BP (!) 154/117   Pulse 97   Temp 98.7 F (37.1 C) (Oral)   Resp 14   Ht 5' 2.5" (1.588 m)   Wt 62.3 kg   SpO2 97%   BMI 24.74 kg/m   Physical Exam  Constitutional: No distress.  HENT:  Head:  Normocephalic and atraumatic.  Mouth/Throat:    Single abrasion noted over the soft palate on the right side.   Eyes: Conjunctivae are normal.  Neck: Normal range of motion. Neck supple.  Cardiovascular: Normal rate and regular rhythm.   Pulmonary/Chest: Effort normal and breath sounds normal. No respiratory distress. She has no wheezes.  Abdominal: Soft. She exhibits no distension. There is no tenderness.  Musculoskeletal: Normal range of motion. She exhibits no edema.  Neurological: She is alert.  Skin: Skin is warm and dry. She is not diaphoretic.  Psychiatric: She has a normal mood and affect. Her behavior is normal.  Nursing note and vitals reviewed.    ED Treatments / Results  Labs (all labs ordered are listed, but only abnormal results are displayed) Labs Reviewed - No data to display  EKG  EKG Interpretation None       [facility]  Result Date: 09/03/2016 CLINICAL DATA:  82 year old female with shortness of breath and chest pain. EXAM: PORTABLE CHEST 1 VIEW COMPARISON:  Chest radiograph dated 12/26/2012 FINDINGS: Left lung base linear and platelike atelectasis/ scarring. There is no focal consolidation, pleural effusion, or pneumothorax. Top-normal cardiac size. The aorta is tortuous. There is advanced atherosclerotic calcification of the thoracic aorta. There is osteopenia with degenerative changes of the shoulders. No acute osseous pathology. IMPRESSION: No active disease. Electronically Signed   By: Elgie CollardArash  Radparvar M.D.   On: 09/03/2016 00:11    Procedures Procedures (including critical care time)  Medications Ordered in ED Medications  lidocaine (XYLOCAINE) 2 % viscous mouth solution 15 mL (15 mLs Mouth/Throat Given 09/02/16 2222)     Initial Impression / Assessment and Plan / ED Course  I have reviewed the triage vital signs and the nursing notes.  Pertinent labs & imaging results that were available during my care of the patient were  reviewed by me and considered in my medical decision making (see chart for details).  Clinical Course      Patient is a 82 year old female who presents today with sore throat. Patient is managing her own secretions. No palatal fullness or uvular deviation was appreciated on my exam. She has a single abrasion to right side of her soft palate. This is likely the source of her pain. Obtained a chest x-ray given the missing dental hardware. No obvious foreign bodies were appreciated on my review of this x-ray. Daughter is agreeable with getting the Magic mouthwash filled in the morning. Advised her to use over-the-counter Chloraseptic for pain control overnight. She is agreeable with this plan and the patient was discharged home in good condition.  Final Clinical Impressions(s) / ED Diagnoses   Final diagnoses:  Sore throat    New Prescriptions  Discharge Medication List as of 09/02/2016 11:13 PM       Madolyn Frieze, MD 09/03/16 1914    Dione Booze, MD 09/03/16 2255

## 2016-09-02 NOTE — ED Triage Notes (Signed)
The pt is c/o severe throat pain since this am when she choked on some of her pills  She lives at [facility]  And she has been unable to eat today from the pain.  Her partial plate is missing and the are questioning if she swallowed it  . The partial is plastic with one tooth on it.  Her daughter has been able to get her to swallow water  But she is not eating

## 2017-02-18 ENCOUNTER — Encounter (HOSPITAL_COMMUNITY): Payer: Self-pay | Admitting: Emergency Medicine

## 2017-02-18 ENCOUNTER — Emergency Department (HOSPITAL_COMMUNITY)
Admission: EM | Admit: 2017-02-18 | Discharge: 2017-02-19 | Disposition: A | Discharge status: Home / Self Care | Payer: Medicare Other | Attending: Emergency Medicine | Admitting: Emergency Medicine

## 2017-02-18 DIAGNOSIS — Y929 Unspecified place or not applicable: Secondary | ICD-10-CM | POA: Diagnosis not present

## 2017-02-18 DIAGNOSIS — W1830XA Fall on same level, unspecified, initial encounter: Secondary | ICD-10-CM | POA: Diagnosis not present

## 2017-02-18 DIAGNOSIS — I2510 Atherosclerotic heart disease of native coronary artery without angina pectoris: Secondary | ICD-10-CM | POA: Diagnosis not present

## 2017-02-18 DIAGNOSIS — E119 Type 2 diabetes mellitus without complications: Secondary | ICD-10-CM | POA: Insufficient documentation

## 2017-02-18 DIAGNOSIS — S42021A Displaced fracture of shaft of right clavicle, initial encounter for closed fracture: Secondary | ICD-10-CM | POA: Diagnosis not present

## 2017-02-18 DIAGNOSIS — I10 Essential (primary) hypertension: Secondary | ICD-10-CM | POA: Diagnosis not present

## 2017-02-18 DIAGNOSIS — Z955 Presence of coronary angioplasty implant and graft: Secondary | ICD-10-CM | POA: Diagnosis not present

## 2017-02-18 DIAGNOSIS — Y939 Activity, unspecified: Secondary | ICD-10-CM | POA: Diagnosis not present

## 2017-02-18 DIAGNOSIS — Y999 Unspecified external cause status: Secondary | ICD-10-CM | POA: Diagnosis not present

## 2017-02-18 DIAGNOSIS — N3000 Acute cystitis without hematuria: Secondary | ICD-10-CM | POA: Diagnosis not present

## 2017-02-18 DIAGNOSIS — S4991XA Unspecified injury of right shoulder and upper arm, initial encounter: Secondary | ICD-10-CM | POA: Diagnosis present

## 2017-02-18 DIAGNOSIS — Z79899 Other long term (current) drug therapy: Secondary | ICD-10-CM | POA: Diagnosis not present

## 2017-02-18 DIAGNOSIS — W19XXXA Unspecified fall, initial encounter: Secondary | ICD-10-CM

## 2017-02-18 NOTE — ED Triage Notes (Signed)
Pt presents via [facility] from [facility] for injuries related to a fall; pt states she fell tonight at 1900, denies striking head, denies LOC; pt reported injury to staff and xray confirmed right clavicle fracture; pt sent here for further eval; pt given 15mg  morphine PO by staff and 50mcg fentanyl by EMS

## 2017-02-19 ENCOUNTER — Emergency Department (HOSPITAL_COMMUNITY): Payer: Medicare Other

## 2017-02-19 LAB — BASIC METABOLIC PANEL
ANION GAP: 10 (ref 5–15)
BUN: 24 mg/dL — ABNORMAL HIGH (ref 6–20)
CHLORIDE: 102 mmol/L (ref 101–111)
CO2: 22 mmol/L (ref 22–32)
Calcium: 9.3 mg/dL (ref 8.9–10.3)
Creatinine, Ser: 1.01 mg/dL — ABNORMAL HIGH (ref 0.44–1.00)
GFR calc Af Amer: 55 mL/min — ABNORMAL LOW (ref >60)
GFR calc non Af Amer: 48 mL/min — ABNORMAL LOW (ref >60)
Glucose, Bld: 190 mg/dL — ABNORMAL HIGH (ref 65–99)
POTASSIUM: 4.3 mmol/L (ref 3.5–5.1)
Sodium: 134 mmol/L — ABNORMAL LOW (ref 135–145)

## 2017-02-19 LAB — CBC WITH DIFFERENTIAL/PLATELET
Basophils Absolute: 0 10*3/uL (ref 0.0–0.1)
Basophils Relative: 0 %
Eosinophils Absolute: 0.1 10*3/uL (ref 0.0–0.7)
Eosinophils Relative: 1 %
HEMATOCRIT: 36.9 % (ref 36.0–46.0)
HEMOGLOBIN: 12.1 g/dL (ref 12.0–15.0)
LYMPHS ABS: 2 10*3/uL (ref 0.7–4.0)
LYMPHS PCT: 19 %
MCH: 29.8 pg (ref 26.0–34.0)
MCHC: 32.8 g/dL (ref 30.0–36.0)
MCV: 90.9 fL (ref 78.0–100.0)
MONOS PCT: 6 %
Monocytes Absolute: 0.6 10*3/uL (ref 0.1–1.0)
NEUTROS ABS: 8.1 10*3/uL — AB (ref 1.7–7.7)
NEUTROS PCT: 74 %
Platelets: 217 10*3/uL (ref 150–400)
RBC: 4.06 MIL/uL (ref 3.87–5.11)
RDW: 13.1 % (ref 11.5–15.5)
WBC: 10.9 10*3/uL — ABNORMAL HIGH (ref 4.0–10.5)

## 2017-02-19 LAB — URINALYSIS, ROUTINE W REFLEX MICROSCOPIC
Bilirubin Urine: NEGATIVE
Glucose, UA: 50 mg/dL — AB
Hgb urine dipstick: NEGATIVE
Ketones, ur: 5 mg/dL — AB
Nitrite: NEGATIVE
Specific Gravity, Urine: 1.015 (ref 1.005–1.030)
pH: 5 (ref 5.0–8.0)

## 2017-02-19 MED ORDER — CEPHALEXIN 250 MG PO CAPS
500.0000 mg | ORAL_CAPSULE | Freq: Once | ORAL | Status: AC
  Administered 2017-02-19: 500 mg via ORAL
  Filled 2017-02-19: qty 2

## 2017-02-19 MED ORDER — CEPHALEXIN 500 MG PO CAPS: 500.0000 mg | ORAL_CAPSULE | Freq: Two times a day (BID) | ORAL | 0 refills | Status: AC

## 2017-02-19 NOTE — Discharge Instructions (Addendum)
PATIENT WILL NEED TO BE UPGRADED TO HIGHER LEVEL OF CARE DUE TO CLAVICLE FRACTURE AND WILL BE UNABLE TO USE WALKER

## 2017-02-19 NOTE — ED Provider Notes (Signed)
[facility]-EMERGENCY DEPT Provider Note   CSN: [id number] Arrival date & time: 02/18/17  2339   By signing my name below, I, Clarisse Gouge, attest that this documentation has been prepared under the direction and in the presence of Zadie Rhine, MD. Electronically signed, Clarisse Gouge, ED Scribe. 02/19/17. 1:23 AM.   History   Chief Complaint Chief Complaint  Patient presents with  . Fall  . Shoulder Injury    RIGHT   The history is provided by the patient and medical records.  Fall  The current episode started 6 to 12 hours ago. Pertinent negatives include no chest pain, no abdominal pain, no headaches and no shortness of breath.  Shoulder Injury  This is a new problem. The current episode started 6 to 12 hours ago. Pertinent negatives include no chest pain, no abdominal pain, no headaches and no shortness of breath.    Gloria Mcintosh is a 83 y.o. female BIB EMS from [facility] to the Emergency Department complaining of bilateral shoulder pains s/p a fall that she cannot fully recall. Triage states the pt fell ~1900 last night and had a R clavicle fracture confirmed at the care facility via X-Ray. No head trauma/ syncope reported. Pt given 15 mg morphine at assisted living facility and 50 mcg fentanyl by EMS, both PTA. No chest, abdomen, neck, back or leg pains. No other complaints at this time.   Past Medical History:  Diagnosis Date  . Arthritis    right shoulder  . Coronary artery disease   . Diabetes mellitus   . Gout 2014  . High cholesterol   . Hypertension   . Tremors of nervous system     Patient Active Problem List   Diagnosis Date Noted  . Hydronephrosis 12/27/2012  . Kidney stone 12/27/2012  . Hyponatremia 12/27/2012  . UTI (urinary tract infection) 12/27/2012  . Acute renal failure (HCC) 12/26/2012  . Hypertension 12/26/2012  . Diabetes mellitus (HCC) 12/26/2012    Past Surgical History:  Procedure Laterality Date  . ABDOMINAL HYSTERECTOMY      . CARDIAC CATHETERIZATION  1999   stent to LAD  . CYSTOSCOPY WITH STENT PLACEMENT Left 12/27/2012   Procedure: CYSTOSCOPY WITH Left JJ STENT PLACEMENT;  Surgeon: Marcine Matar, MD;  Location: [facility] OR;  Service: Urology;  Laterality: Left;  . HAND SURGERY     trigger finger and carpal tunnel    OB History    No data available       Home Medications    Prior to Admission medications   Medication Sig Start Date End Date Taking? Authorizing Provider  amLODipine (NORVASC) 5 MG tablet Take 1 tablet (5 mg total) by mouth daily. 01/03/13   Kirby Funk, MD  colesevelam [facility]) 625 MG tablet Take 3 tablets (1,875 mg total) by mouth 2 (two) times daily with a meal. 01/03/13   Kirby Funk, MD  levothyroxine (SYNTHROID, LEVOTHROID) 88 MCG tablet Take 88 mcg by mouth daily before breakfast.    [provider]  metoprolol (LOPRESSOR) 50 MG tablet Take 25 mg by mouth daily.    [provider]  oxyCODONE-acetaminophen (PERCOCET/ROXICET) 5-325 MG per tablet Take 1 tablet by mouth every 4 (four) hours as needed for pain.    [provider]  tamsulosin (FLOMAX) 0.4 MG CAPS Take 1 capsule (0.4 mg total) by mouth daily after supper. 12/21/12   Gwyneth Sprout, MD    Family History Family History  Problem Relation Age of Onset  . Rheum arthritis  Mother   . Lymphoma Mother   . Hypertension Mother   . Diabetes type II Father   . Stroke Sister   . Diabetes type II Brother   . Hypertension Daughter   . Hypertension Son   . Diabetes type II Son     Social History Social History  Substance Use Topics  . Smoking status: Never Smoker  . Smokeless tobacco: Never Used  . Alcohol use No     Allergies   Inderal [propranolol hcl]   Review of Systems Review of Systems  Respiratory: Negative for shortness of breath.   Cardiovascular: Negative for chest pain.  Gastrointestinal: Negative for abdominal pain.  Musculoskeletal: Positive for arthralgias. Negative for  back pain and joint swelling.  Skin: Negative for color change and wound.  Neurological: Negative for headaches.  All other systems reviewed and are negative.    Physical Exam Updated Vital Signs BP (!) 196/89 (BP Location: Left Arm)   Pulse 78   Resp 18   SpO2 98%   Physical Exam CONSTITUTIONAL: elderly and frail HEAD: Normocephalic/atraumatic EYES: EOMI/PERRL ENMT: Mucous membranes moist NECK: supple no meningeal signs SPINE/BACK:entire spine nontender CV: S1/S2 noted, no murmurs/rubs/gallops noted LUNGS: Lungs are clear to auscultation bilaterally, no apparent distress ABDOMEN: soft, nontender, no rebound or guarding, bowel sounds noted throughout abdomen GU:no cva tenderness NEURO: Pt is awake/alert, moves all extremitiesx4.  No facial droop.   EXTREMITIES: pulses normal/equal, full ROM, no shoulder or clavicle deformity noted, pelvis stable, All other extremities/joints palpated/ranged and nontender  SKIN: warm, color normal  ED Treatments / Results  DIAGNOSTIC STUDIES: Oxygen Saturation is 98% on RA, NL by my interpretation.    COORDINATION OF CARE: 1:17 AM-Discussed next steps with pt. Pt verbalized understanding and is agreeable with the plan. Will order imaging.   Labs (all labs ordered are listed, but only abnormal results are displayed) Labs Reviewed  BASIC METABOLIC PANEL - Abnormal; Notable for the following:       Result Value   Sodium 134 (*)    Glucose, Bld 190 (*)    BUN 24 (*)    Creatinine, Ser 1.01 (*)    GFR calc non Af Amer 48 (*)    GFR calc Af Amer 55 (*)    All other components within normal limits  CBC WITH DIFFERENTIAL/PLATELET - Abnormal; Notable for the following:    WBC 10.9 (*)    Neutro Abs 8.1 (*)    All other components within normal limits  URINALYSIS, ROUTINE W REFLEX MICROSCOPIC - Abnormal; Notable for the following:    APPearance HAZY (*)    Glucose, UA 50 (*)    Ketones, ur 5 (*)    Protein, ur >=300 (*)    Leukocytes, UA  LARGE (*)    Bacteria, UA RARE (*)    Squamous Epithelial / LPF 0-5 (*)    All other components within normal limits  URINE CULTURE    EKG  EKG Interpretation  Date/Time:  Tuesday February 19 2017 01:32:32 EDT Ventricular Rate:  85 PR Interval:  158 QRS Duration: 140 QT Interval:  406 QTC Calculation: 483 R Axis:   86 Text Interpretation:  Normal sinus rhythm Right bundle branch block Lateral infarct , age undetermined Inferior infarct , age undetermined Abnormal ECG No significant change since last tracing Confirmed by Zadie Rhine ([id number]) on 02/19/2017 1:58:24 AM       Radiology Dg Chest 1 View  Result Date: 02/19/2017 CLINICAL DATA:  Right shoulder pain,  fracture. EXAM: CHEST 1 VIEW COMPARISON:  09/02/2016 CXR FINDINGS: Osteoarthritis of the glenohumeral joints bilaterally. Acute displaced midshaft fracture of the right clavicle with slight overriding of the fracture fragments and 1 shaft with caudal displacement of the distal fracture fragment. No intra-articular involvement. Stable cardiomegaly with aortic atherosclerosis. No aneurysm. No acute pulmonary consolidation. Left lower lobe scarring. No pneumothorax nor effusion. IMPRESSION: 1. No acute cardiopulmonary disease. Stable cardiomegaly with aortic atherosclerosis. 2. Acute, closed displaced midshaft fracture of the right clavicle. Electronically Signed   By: Tollie Ethavid  Kwon M.D.   On: 02/19/2017 02:36    Procedures Procedures (including critical care time)  Medications Ordered in ED Medications - No data to display   Initial Impression / Assessment and Plan / ED Course  I have reviewed the triage vital signs and the nursing notes.  Pertinent labs & imaging results that were available during my care of the patient were reviewed by me and considered in my medical decision making (see chart for details).     1:26 AM D/w stephanie at [facility] Pt was found on floor at 1900, unclear cause of fall Mobile xray found  right clavicle fracture No other acute issue She is not on anticoagulants She uses walker for ambulation She does have intermittent confusion at times She will need to be upgraded to higher level of care since she uses walker and may have clavicle injury Will check labs/ekg due to unclear cause of fall 4:43 AM IMAGING CONFIRMS CLAVICLE FRACTURE PT FOUND TO HAVE UTI OTHERWISE AT BASELINE NO DISTRESS RESTING COMFORTABLY SLING APPLIED WILL D/C BACK TO FACILITY  SPLINT APPLICATION Date/Time: 4:43 AM Authorized by: Joya GaskinsWICKLINE,Clemente Dewey W Consent: Verbal consent obtained. Risks and benefits: risks, benefits and alternatives were discussed Consent given by: patient Splint applied SA:[id number]:NURSE Location details: RIGHT UPPER EXTREMITY Splint type: SLING Supplies used: SLING Post-procedure: The splinted body part was neurovascularly unchanged following the procedure. Patient tolerance: Patient tolerated the procedure well with no immediate complications.     Final Clinical Impressions(s) / ED Diagnoses   Final diagnoses:  Fall, initial encounter  Closed displaced fracture of shaft of right clavicle, initial encounter  Acute cystitis without hematuria    New Prescriptions New Prescriptions   CEPHALEXIN (KEFLEX) 500 MG CAPSULE    Take 1 capsule (500 mg total) by mouth 2 (two) times daily.  I personally performed the services described in this documentation, which was scribed in my presence. The recorded information has been reviewed and is accurate.        Zadie RhineWickline, Victorine Mcnee, MD 02/19/17 [phone]

## 2017-02-19 NOTE — ED Notes (Signed)
Patient returned from XRAY 

## 2017-02-19 NOTE — ED Notes (Signed)
Patient taken to XRAY

## 2017-02-19 NOTE — ED Notes (Signed)
[facility]

## 2017-02-19 NOTE — ED Notes (Signed)
Patient transported to X-ray 

## 2017-02-21 LAB — URINE CULTURE

## 2018-06-17 DEATH — deceased
